# Patient Record
Sex: Female | Born: 1935 | Smoking: Never smoker
Health system: Southern US, Community
[De-identification: ages and names within clinical notes are randomized; demographics above are authoritative.]

## PROBLEM LIST (undated history)

## (undated) DIAGNOSIS — C50919 Malignant neoplasm of unspecified site of unspecified female breast: Secondary | ICD-10-CM

## (undated) HISTORY — PX: MASTECTOMY: SHX3

## (undated) HISTORY — PX: ABDOMINAL HYSTERECTOMY: SHX81

---

## 2017-05-27 ENCOUNTER — Ambulatory Visit: Payer: Medicare Other | Attending: Physician Assistant | Admitting: Physical Therapy

## 2017-05-27 DIAGNOSIS — I972 Postmastectomy lymphedema syndrome: Secondary | ICD-10-CM | POA: Diagnosis not present

## 2017-05-27 DIAGNOSIS — R29898 Other symptoms and signs involving the musculoskeletal system: Secondary | ICD-10-CM | POA: Diagnosis present

## 2017-05-27 NOTE — Therapy (Addendum)
[place], [place], [address] Phone: [phone]   Fax:  [phone]  Physical Therapy Evaluation  Patient Details  Name: Monique King MRN: [id number] Date of Birth: Sep 01, 1933 No Data Recorded  Encounter Date: 05/27/2017      PT End of Session - 05/27/17 2106    Visit Number 1   Number of Visits 7   Date for PT Re-Evaluation 07/03/17   PT Start Time 0935   PT Stop Time 1033   PT Time Calculation (min) 58 min   Activity Tolerance Patient tolerated treatment well   Behavior During Therapy [facility] for tasks assessed/performed      No past medical history on file.  No past surgical history on file.  There were no vitals filed for this visit.       Subjective Assessment - 05/27/17 0939    Subjective "I have no idea why I'm here today, except they want you to check this arm."  Daughter explains that patient just moved here from [place] and the doctor just wants [place] to check her lymphmedematous arm.   Patient is accompained by: Family member  daughter   Pertinent History Right breast 1976, treated with mastectomy + ALND (had positive lymph nodes), chemo and radiation. Swelling developed soon after that.  She has had a pump and has worn compression sleeves.  Now has a velcro.  Knee arthritis. Diabetes, type II with oral meds that control it.  Has had right RTC surgery. Has had a coupleof cellulitis infections in the right arm.  Has Fentanyl patch for right knee. Has had carpal tunnel surgery in right arm   Patient Stated Goals check in and get established locally re: lymphedema treatment   Currently in Pain? Yes   Pain Score 0-No pain  up to 6/10 when standing on it   Pain Location Knee   Pain Orientation Right;Left   Pain Descriptors / Indicators Dull   Aggravating Factors  standing on it   Pain Relieving Factors not standing on it, Fentanyl patch            [facility] PT Assessment -  05/27/17 0001      Assessment   Medical Diagnosis remote h/o breast cancer   Onset Date/Surgical Date --  in 1976 had radical mastectomy   Hand Dominance Right  but needs to use the left due to right UE non-function   Prior Therapy had assessment by PT [facility]     Precautions   Precautions Fall     Restrictions   Weight Bearing Restrictions No     Balance Screen   Has the patient fallen in the past 6 months No   Has the patient had a decrease in activity level because of a fear of falling?  No   Is the patient reluctant to leave their home because of a fear of falling?  No     Home Environment   Living Environment Assisted living  [place] - manual     Prior Function   Level of Independence Independent with household mobility with device;Independent with basic ADLs;Requires assistive device for independence   Vocation Retired   Leisure no exercise, but she may start PT for strengthening     Cognition   Overall Cognitive Status Impaired/Different from baseline  relies on her daughter to fill in details     Observation/Other Assessments   Observations older woman sitting in  wheelchair, leaning left, with right arm in velcro compression and lying flaccid on her lap.  Has an old Circaid Juxtafit with power sleeve on arm and hand; has a newer Circaid Juxtafit in the package.  Daughter has also brought some older garments.  Also has an Elvarex gauntlet.   Skin Integrity right hand is reddened and has some dead skin.     Sensation   Light Touch Not tested  but she reports it is numb in right UE     Posture/Postural Control   Posture/Postural Control Postural limitations   Postural Limitations Rounded Shoulders;Forward head     ROM / Strength   AROM / PROM / Strength AROM;PROM     AROM   Overall AROM Comments left arm AROM WFL; right arm has no functional movement and no visible muscle contraction at all     PROM    Overall PROM Comments right elbow to approx. 90 degrees flexion; grossly, fingers show almost no flexion passively     Transfers   Transfers Stand Pivot Transfers  stand by to moderate assist; tends to flop down     Ambulation/Gait   Gait Comments Comes in in a wheelchair; says she can walk in her room with a cane, due to arthritis in her knees.  She does not want knee replacement.           LYMPHEDEMA/ONCOLOGY QUESTIONNAIRE - 05/27/17 0957      Type   Cancer Type remote h/o right breast cancer     Surgeries   Mastectomy Date --  radical, in 1976   Axillary Lymph Node Dissection Date --  1976     Treatment   Past Chemotherapy Treatment Yes   Past Radiation Treatment Yes     What other symptoms do you have   Are you Having Heaviness or Tightness Yes     Lymphedema Assessments   Lymphedema Assessments Upper extremities     Right Upper Extremity Lymphedema   15 cm Proximal to Olecranon Process 27.9 cm   10 cm Proximal to Olecranon Process 27.8 cm   Olecranon Process 28.6 cm   15 cm Proximal to Ulnar Styloid Process 29.2 cm   10 cm Proximal to Ulnar Styloid Process 28.3 cm   Just Proximal to Ulnar Styloid Process 24 cm   Across Hand at [facility] 23.1 cm   At [place] of 2nd Digit 9.7 cm     Left Upper Extremity Lymphedema   15 cm Proximal to Olecranon Process 37.8 cm   10 cm Proximal to Olecranon Process 34.7 cm   Olecranon Process 26.5 cm   15 cm Proximal to Ulnar Styloid Process 26.7 cm   10 cm Proximal to Ulnar Styloid Process 24.8 cm   Just Proximal to Ulnar Styloid Process 18.1 cm   Across Hand at [facility] 19.8 cm   At [place] of 2nd Digit 6.5 cm         Objective measurements completed on examination: See above findings.                          Long Term Clinic Goals - 05/27/17 2125      CC Long Term Goal  #1   Title Patient's right finger and hand swelling will reduce enough for her to wear her newer Circaid Juxtafit  garment.   Time 2   Period Weeks   Status New     CC Long Term  Goal  #2   Title Pt.'s right 2nd finger circumference at base will reduce to 8.5 cm. or less   Baseline 9.7 at eval compared to 6.5 on left   Time 2   Period Weeks   Status New     CC Long Term Goal  #3   Title Patient's Rt. hand circumference will reduce to 21.5 cm. or less   Baseline 32.1 compared to 19.8 on left at eval   Time 2   Period Weeks   Status New     CC Long Term Goal  #4   Title lymphedema life impact scale impairment percentage will reduce to 40% or less   Baseline 57% at eval   Time 2   Period Weeks   Status New             Plan - 05/27/17 2108    Clinical Impression Statement This is a pleasant woman with a somewhat colorful personality who comes in in a wheelchair today.  Her right arm hangs limply in her lap and is wrapped in a well-worn Circaid Juxtafit garment. Her daughter (who is a Event organiser) is with her and fills in details when her mother is unclear about reporting.  Pt. had right breast cancer with radical mastectomy and ALND, along with chemo and radiation, some 40 years ago, and developed swelling not too long after that.  Weakness in the right arm developed over time so that now the arm is flaccid.  Her posture is very round-shouldered, but she tends to lean back in sitting.  Her right UE has significant swelling, particularly in the hand. Skin on the hand is reddened and dry except between fingers where there is mild maceration. The Circaid glove is too small so that her fingers don't go into it far enough.  She has a newer Circaid Juxtafit and a couple of other garments, one of them looking very old, that her daughter has brought along today.  The newer garment would certainly not fit her as the fingers on that glove are smaller than the one she has on today.  Pt. lives at assisted living, having just moved here days ago from [place]. She gets a bath 3x/week,  and aids remove the garment for that and then help her don it again.  It is difficult to put on because of her arm's flaccidity and current edema. She will benefit from a short course of bandaging her fingers and hand while continuing to use a Juxtafit garment on her arm in an effort to reduce the swelling in fingers and hand.  This should help the health of her skin and soft tissue of that area and also make it easier to don her newer Juxtafit garment.   History and Personal Factors relevant to plan of care: h/o cellulitis requiring hospitalization, diabetes, in wheelchair for someof her mobility, flaccid edematous arm and hand, possible memory problems and lives in assisted living facility   Clinical Presentation Unstable   Clinical Presentation due to: fragile skin on swollen hand with h/o cellulitis; mild skin maceration between fingers, with patient unable to manage right UE garment herself   Clinical Decision Making High   Rehab Potential Good   Clinical Impairments Affecting Rehab Potential flaccid right UE; not independent for ADLs   PT Frequency 3x / week   PT Duration 2 weeks   PT Treatment/Interventions ADLs/Self Care Home Management;DME Instruction;Patient/family education;Orthotic Fit/Training;Manual techniques;Manual lymph drainage;Compression bandaging   PT Next Visit  Plan Begin short (2 week) course of manual lymph drainage and bandaging of fingers and hand on right side while having her continue to use the Juxtafit on her arm.     Consulted and Agree with Plan of Care Patient;Family member/caregiver      Patient will benefit from skilled therapeutic intervention in order to improve the following deficits and impairments:  Increased edema, Decreased skin integrity, Decreased knowledge of use of DME  Visit Diagnosis: Postmastectomy lymphedema - Plan: PT plan of care cert/re-cert  Other symptoms and signs involving the musculoskeletal system - Plan: PT plan of care cert/re-cert       G-Codes - 06/26/17 2128    Functional Assessment Tool Used (Outpatient Only) lymphedema life impact scale   Functional Limitation Self care   Self Care Current Status (X4481) At least 20 percent but less than 40 percent impaired, limited or restricted    Self Care Goal Status = CI (at least 1% but less than 20% restricted)   Problem List There are no active problems to display for this patient.   [place] 06/26/2017, 9:33 PM  [place], [place], [address] Phone: [phone]   Fax:  [phone]  Name: Monique King MRN: [id number] Date of Birth: 13-Aug-1933  Serafina Royals, PT 06/26/17 9:34 PM  PHYSICAL THERAPY DISCHARGE SUMMARY  Visits from Start of Care: 1  Current functional level related to goals / functional outcomes: No goals met. Patient's daughter called to say the patient chooses not to have any therapy at this time.   Remaining deficits: Unknown, as patient did not return after evaluation.   Education / Equipment: Suggested treatment Plan: Patient agrees to discharge.  Patient goals were not met. Patient is being discharged due to the patient's request.  ?????    Serafina Royals, PT 06/23/17 6:12 PM

## 2017-06-10 ENCOUNTER — Ambulatory Visit: Payer: Medicare Other | Admitting: Physical Therapy

## 2019-09-30 ENCOUNTER — Encounter (HOSPITAL_COMMUNITY): Payer: Self-pay

## 2019-09-30 ENCOUNTER — Other Ambulatory Visit: Payer: Self-pay

## 2019-09-30 ENCOUNTER — Inpatient Hospital Stay (HOSPITAL_COMMUNITY)
Admission: EM | Admit: 2019-09-30 | Discharge: 2019-10-06 | DRG: 177 | Disposition: A | Discharge status: Skilled Nursing Facility | Payer: Medicare Other | Source: Skilled Nursing Facility | Attending: Internal Medicine | Admitting: Internal Medicine

## 2019-09-30 ENCOUNTER — Emergency Department (HOSPITAL_COMMUNITY): Payer: Medicare Other

## 2019-09-30 DIAGNOSIS — R0600 Dyspnea, unspecified: Secondary | ICD-10-CM

## 2019-09-30 DIAGNOSIS — I4510 Unspecified right bundle-branch block: Secondary | ICD-10-CM | POA: Diagnosis present

## 2019-09-30 DIAGNOSIS — R509 Fever, unspecified: Secondary | ICD-10-CM | POA: Diagnosis not present

## 2019-09-30 DIAGNOSIS — E119 Type 2 diabetes mellitus without complications: Secondary | ICD-10-CM | POA: Diagnosis present

## 2019-09-30 DIAGNOSIS — Z66 Do not resuscitate: Secondary | ICD-10-CM | POA: Diagnosis present

## 2019-09-30 DIAGNOSIS — J9601 Acute respiratory failure with hypoxia: Secondary | ICD-10-CM | POA: Diagnosis present

## 2019-09-30 DIAGNOSIS — D509 Iron deficiency anemia, unspecified: Secondary | ICD-10-CM | POA: Diagnosis present

## 2019-09-30 DIAGNOSIS — R011 Cardiac murmur, unspecified: Secondary | ICD-10-CM | POA: Diagnosis present

## 2019-09-30 DIAGNOSIS — I440 Atrioventricular block, first degree: Secondary | ICD-10-CM | POA: Diagnosis present

## 2019-09-30 DIAGNOSIS — I10 Essential (primary) hypertension: Secondary | ICD-10-CM | POA: Diagnosis present

## 2019-09-30 DIAGNOSIS — R0902 Hypoxemia: Secondary | ICD-10-CM

## 2019-09-30 DIAGNOSIS — I5032 Chronic diastolic (congestive) heart failure: Secondary | ICD-10-CM | POA: Diagnosis present

## 2019-09-30 DIAGNOSIS — I350 Nonrheumatic aortic (valve) stenosis: Secondary | ICD-10-CM | POA: Diagnosis present

## 2019-09-30 DIAGNOSIS — F0390 Unspecified dementia without behavioral disturbance: Secondary | ICD-10-CM | POA: Diagnosis present

## 2019-09-30 DIAGNOSIS — Z7984 Long term (current) use of oral hypoglycemic drugs: Secondary | ICD-10-CM

## 2019-09-30 DIAGNOSIS — L03113 Cellulitis of right upper limb: Secondary | ICD-10-CM | POA: Diagnosis present

## 2019-09-30 DIAGNOSIS — R7881 Bacteremia: Secondary | ICD-10-CM

## 2019-09-30 DIAGNOSIS — E785 Hyperlipidemia, unspecified: Secondary | ICD-10-CM | POA: Diagnosis present

## 2019-09-30 DIAGNOSIS — Z9011 Acquired absence of right breast and nipple: Secondary | ICD-10-CM

## 2019-09-30 DIAGNOSIS — D696 Thrombocytopenia, unspecified: Secondary | ICD-10-CM | POA: Diagnosis present

## 2019-09-30 DIAGNOSIS — B9561 Methicillin susceptible Staphylococcus aureus infection as the cause of diseases classified elsewhere: Secondary | ICD-10-CM | POA: Diagnosis present

## 2019-09-30 DIAGNOSIS — Z79891 Long term (current) use of opiate analgesic: Secondary | ICD-10-CM

## 2019-09-30 DIAGNOSIS — U071 COVID-19: Secondary | ICD-10-CM | POA: Diagnosis not present

## 2019-09-30 DIAGNOSIS — I890 Lymphedema, not elsewhere classified: Secondary | ICD-10-CM | POA: Diagnosis present

## 2019-09-30 DIAGNOSIS — J1289 Other viral pneumonia: Secondary | ICD-10-CM | POA: Diagnosis present

## 2019-09-30 DIAGNOSIS — E669 Obesity, unspecified: Secondary | ICD-10-CM | POA: Diagnosis present

## 2019-09-30 DIAGNOSIS — Z6829 Body mass index (BMI) 29.0-29.9, adult: Secondary | ICD-10-CM

## 2019-09-30 DIAGNOSIS — Z853 Personal history of malignant neoplasm of breast: Secondary | ICD-10-CM

## 2019-09-30 HISTORY — DX: Malignant neoplasm of unspecified site of unspecified female breast: C50.919

## 2019-09-30 LAB — TROPONIN I (HIGH SENSITIVITY)
Troponin I (High Sensitivity): 8 ng/L (ref <18)
Troponin I (High Sensitivity): 10 ng/L (ref <18)

## 2019-09-30 LAB — CBC WITH DIFFERENTIAL/PLATELET
Abs Immature Granulocytes: 0.03 10*3/uL (ref 0.00–0.07)
Basophils Absolute: 0 10*3/uL (ref 0.0–0.1)
Basophils Relative: 0 %
Eosinophils Absolute: 0 10*3/uL (ref 0.0–0.5)
Eosinophils Relative: 1 %
HCT: 38.9 % (ref 36.0–46.0)
Hemoglobin: 12.5 g/dL (ref 12.0–15.0)
Immature Granulocytes: 1 %
Lymphocytes Relative: 7 %
Lymphs Abs: 0.3 10*3/uL — ABNORMAL LOW (ref 0.7–4.0)
MCH: 32.2 pg (ref 26.0–34.0)
MCHC: 32.1 g/dL (ref 30.0–36.0)
MCV: 100.3 fL — ABNORMAL HIGH (ref 80.0–100.0)
Monocytes Absolute: 0.2 10*3/uL (ref 0.1–1.0)
Monocytes Relative: 5 %
Neutro Abs: 2.9 10*3/uL (ref 1.7–7.7)
Neutrophils Relative %: 86 %
Platelets: 89 10*3/uL — ABNORMAL LOW (ref 150–400)
RBC: 3.88 MIL/uL (ref 3.87–5.11)
RDW: 13.6 % (ref 11.5–15.5)
WBC: 3.4 10*3/uL — ABNORMAL LOW (ref 4.0–10.5)
nRBC: 0 % (ref 0.0–0.2)

## 2019-09-30 LAB — COMPREHENSIVE METABOLIC PANEL
ALT: 19 U/L (ref 0–44)
AST: 21 U/L (ref 15–41)
Albumin: 3.7 g/dL (ref 3.5–5.0)
Alkaline Phosphatase: 120 U/L (ref 38–126)
Anion gap: 8 (ref 5–15)
BUN: 15 mg/dL (ref 8–23)
CO2: 24 mmol/L (ref 22–32)
Calcium: 7.9 mg/dL — ABNORMAL LOW (ref 8.9–10.3)
Chloride: 106 mmol/L (ref 98–111)
Creatinine, Ser: 0.67 mg/dL (ref 0.44–1.00)
GFR calc Af Amer: >60 mL/min (ref >60)
GFR calc non Af Amer: >60 mL/min (ref >60)
Glucose, Bld: 163 mg/dL — ABNORMAL HIGH (ref 70–99)
Potassium: 3.8 mmol/L (ref 3.5–5.1)
Sodium: 138 mmol/L (ref 135–145)
Total Bilirubin: 0.6 mg/dL (ref 0.3–1.2)
Total Protein: 6.6 g/dL (ref 6.5–8.1)

## 2019-09-30 LAB — BRAIN NATRIURETIC PEPTIDE: B Natriuretic Peptide: 38.4 pg/mL (ref 0.0–100.0)

## 2019-09-30 LAB — LACTIC ACID, PLASMA
Lactic Acid, Venous: 1.4 mmol/L (ref 0.5–1.9)
Lactic Acid, Venous: 1.1 mmol/L (ref 0.5–1.9)

## 2019-09-30 MED ORDER — ACETAMINOPHEN 325 MG PO TABS
650.0000 mg | ORAL_TABLET | Freq: Once | ORAL | Status: AC
  Administered 2019-09-30: 650 mg via ORAL

## 2019-09-30 NOTE — ED Triage Notes (Addendum)
Pt BIB EMS from [facility]. Per home health nurse, pt is SOB on exertion. Per EMS, pt reading mid 80's O2 on RA. Pt does not use home O2. Pt has no hx of asthma, COPD. Pt denies pain. Pt c/o cough, fever.   103.3 temp 96% O2 on 4L [place] CBG 148  20G L hand  127/65 97 HR

## 2019-09-30 NOTE — ED Notes (Signed)
Spoke with floor nurse, [place] [phone], and relayed message from Maudie Mercury MD, that he would like to hold off on moving the pt upstairs until he receives ABG results.

## 2019-09-30 NOTE — H&P (Signed)
TRH H&P    Patient Demographics:    Monique King, is a 85 y.o. female  MRN: [id number]  DOB - Sep 03, 1933  Admit Date - 09/30/2019  Referring MD/NP/PA: Howell Pringle  Outpatient Primary MD for the patient is Reymundo Poll, MD  Patient coming from:  [place]  Chief complaint-  hypoxia   HPI:    Monique King  is a 85 y.o. female,  w h/o breast cancer, apparently presents from [place] secondary to cough, fever, and hypoxia. Pt appears to be a poor historian.  Pt denies cough, but coughs while Im in the room.  Appears nonproductive.   In ED,  T 101.9, P 90 R 22, Bp 146/75  Pox 96%  CXR IMPRESSION: No acute abnormality noted.  Wbc 3.4, Hgb 12.5, Plt 89 Na 138, K 3.8, Bun 15, Creatinine 0.67 Ast 21, Alt 19 Lactic acid 1.4  Blood culture x2 Urinalysis pending  Pt will be admitted for fever and ? Hypoxia.       Review of systems:    In addition to the HPI above,    No Headache, No changes with Vision or hearing, No problems swallowing food or Liquids, No Chest pain,  No Abdominal pain, No Nausea or Vomiting, bowel movements are regular, No Blood in stool or Urine, No dysuria, No new skin rashes or bruises, No new joints pains-aches,  No new weakness, tingling, numbness in any extremity, No recent weight gain or loss, No polyuria, polydypsia or polyphagia, No significant Mental Stressors.  All other systems reviewed and are negative.    Past History of the following :    Past Medical History:  Diagnosis Date  . Breast cancer [facility])       Past Surgical History:  Procedure Laterality Date  . ABDOMINAL HYSTERECTOMY    . MASTECTOMY Right       Social History:      Social History   Tobacco Use  . Smoking status: Never Smoker  . Smokeless tobacco: Never Used  Substance Use Topics  . Alcohol use: Never    Frequency: Never       Family History  :    History reviewed. No pertinent family history.  pt can't recall any family history.   Home Medications:   Prior to Admission medications   Medication Sig Start Date End Date Taking? Authorizing Provider  acetaminophen (TYLENOL) 325 MG tablet Take 325 mg by mouth 2 (two) times daily.    Yes [provider]  diazepam (VALIUM) 2 MG tablet Take 1 mg by mouth at bedtime as needed for anxiety.   Yes [provider]  docusate sodium (COLACE) 100 MG capsule Take 100 mg by mouth daily.   Yes [provider]  fentaNYL (DURAGESIC) 25 MCG/HR Place 1 patch onto the skin every 3 (three) days.   Yes [provider]  ferrous sulfate 325 (65 FE) MG EC tablet Take 325 mg by mouth See admin instructions. Every Monday, Wednesday, and Friday.   Yes [provider]  furosemide (LASIX)  20 MG tablet Take 20 mg by mouth.   Yes [provider]  glipiZIDE (GLUCOTROL) 5 MG tablet Take 2.5 mg by mouth daily before breakfast.   Yes [provider]  Infant Care Products (DERMACLOUD) CREA Apply 1 application topically 2 (two) times daily.   Yes [provider]  ketoconazole (NIZORAL) 2 % cream Apply 1 application topically at bedtime. Left breast and bottom.   Yes [provider]  mometasone (ELOCON) 0.1 % ointment Apply 1 application topically 2 (two) times daily. To affected areas on the body and ears.   Yes [provider]  selenium sulfide (SELSUN) 2.5 % shampoo Apply 1 application topically every other day.   Yes [provider]  simvastatin (ZOCOR) 10 MG tablet Take 10 mg by mouth daily.   Yes [provider]  traZODone (DESYREL) 50 MG tablet Take 50 mg by mouth at bedtime.   Yes [provider]  triamcinolone (KENALOG) 0.025 % cream Apply 1 application topically daily as needed. To left breast.   Yes [provider]     Allergies:    No Known Allergies   Physical Exam:   Vitals   Blood pressure (!) 142/66, pulse 89, temperature (!) 101.9 F (38.8 C), temperature source Oral, resp. rate (!) 23, SpO2 99 %.  1.  General: axoxo3  2. Psychiatric: euthymic  3. Neurologic: Cn 2-12 intact, reflexes 2+ symmetric, diffuse with no clonus Motor 5/5 in all 4 ext  4. HEENMT:  Anicteric, pupils 1.106mm symmetric, direct, consensual, intact Neck: no jvd  5. Respiratory : + crackles left base, and right mid lung , no wheezing  6. Cardiovascular : rrr s1, s2,  2/6 sem rusb 7. Gastrointestinal:  Abd: soft, nt, nd, +bs  8. Skin:  Ext: no c/c/e,. No rash  9.Musculoskeletal Good ROM    Data Review:    CBC Recent Labs  Lab 09/30/19 1728  WBC 3.4*  HGB 12.5  HCT 38.9  PLT 89*  MCV 100.3*  MCH 32.2  MCHC 32.1  RDW 13.6  LYMPHSABS 0.3*  MONOABS 0.2  EOSABS 0.0  BASOSABS 0.0   ------------------------------------------------------------------------------------------------------------------  Results for orders placed or performed during the hospital encounter of 09/30/19 (from the past 48 hour(s))  Lactic acid, plasma     Status: None   Collection Time: 09/30/19  5:28 PM  Result Value Ref Range   Lactic Acid, Venous 1.4 0.5 - 1.9 mmol/L    Comment: Performed at [facility], [place] [address], [place], [place] [address]  CBC with Differential     Status: Abnormal   Collection Time: 09/30/19  5:28 PM  Result Value Ref Range   WBC 3.4 (L) 4.0 - 10.5 K/uL   RBC 3.88 3.87 - 5.11 MIL/uL   Hemoglobin 12.5 12.0 - 15.0 g/dL   HCT 38.9 36.0 - 46.0 %   MCV 100.3 (H) 80.0 - 100.0 fL   MCH 32.2 26.0 - 34.0 pg   MCHC 32.1 30.0 - 36.0 g/dL   RDW 13.6 11.5 - 15.5 %   Platelets 89 (L) 150 - 400 K/uL    Comment: REPEATED TO VERIFY PLATELET COUNT CONFIRMED BY SMEAR SPECIMEN CHECKED FOR CLOTS Immature Platelet Fraction may be clinically indicated, consider ordering this additional test [id number]    nRBC 0.0 0.0 - 0.2 %   Neutrophils Relative %  86 %   Neutro Abs 2.9 1.7 - 7.7 K/uL   Lymphocytes Relative 7 %   Lymphs Abs 0.3 (  L) 0.7 - 4.0 K/uL   Monocytes Relative 5 %   Monocytes Absolute 0.2 0.1 - 1.0 K/uL   Eosinophils Relative 1 %   Eosinophils Absolute 0.0 0.0 - 0.5 K/uL   Basophils Relative 0 %   Basophils Absolute 0.0 0.0 - 0.1 K/uL   Immature Granulocytes 1 %   Abs Immature Granulocytes 0.03 0.00 - 0.07 K/uL    Comment: Performed at [facility], [place] [address] [place], [place] [address]  Brain natriuretic peptide     Status: None   Collection Time: 09/30/19  5:28 PM  Result Value Ref Range   B Natriuretic Peptide 38.4 0.0 - 100.0 pg/mL    Comment: Performed at [facility], [place] [address], [place], [place] [address]  Troponin I (High Sensitivity)     Status: None   Collection Time: 09/30/19  5:28 PM  Result Value Ref Range   Troponin I (High Sensitivity) 8 <18 ng/L    Comment: (NOTE) Elevated high sensitivity troponin I (hsTnI) values and significant  changes across serial measurements may suggest ACS but many other  chronic and acute conditions are known to elevate hsTnI results.  Refer to the Links section for chest pain algorithms and additional  guidance. Performed at [facility], [place] [address] [place], [place] [address]   Lactic acid, plasma     Status: None   Collection Time: 09/30/19  7:11 PM  Result Value Ref Range   Lactic Acid, Venous 1.1 0.5 - 1.9 mmol/L    Comment: Performed at [facility], [place] [address] [place], [place] [address]  Comprehensive metabolic panel     Status: Abnormal   Collection Time: 09/30/19  7:11 PM  Result Value Ref Range   Sodium 138 135 - 145 mmol/L   Potassium 3.8 3.5 - 5.1 mmol/L   Chloride 106 98 - 111 mmol/L   CO2 24 22 - 32 mmol/L   Glucose, Bld 163 (H) 70 - 99 mg/dL   BUN 15 8 - 23 mg/dL   Creatinine, Ser 0.67 0.44 - 1.00 mg/dL   Calcium 7.9 (L) 8.9 - 10.3 mg/dL   Total Protein 6.6  6.5 - 8.1 g/dL   Albumin 3.7 3.5 - 5.0 g/dL   AST 21 15 - 41 U/L   ALT 19 0 - 44 U/L   Alkaline Phosphatase 120 38 - 126 U/L   Total Bilirubin 0.6 0.3 - 1.2 mg/dL   GFR calc non Af Amer >60 >60 mL/min   GFR calc Af Amer >60 >60 mL/min   Anion gap 8 5 - 15    Comment: Performed at [facility], [place] [address] [place], [place] [address]  Troponin I (High Sensitivity)     Status: None   Collection Time: 09/30/19  7:11 PM  Result Value Ref Range   Troponin I (High Sensitivity) 10 <18 ng/L    Comment: (NOTE) Elevated high sensitivity troponin I (hsTnI) values and significant  changes across serial measurements may suggest ACS but many other  chronic and acute conditions are known to elevate hsTnI results.  Refer to the "Links" section for chest pain algorithms and additional  guidance. Performed at [facility], [place] [address] [place], [place] [address]     Chemistries  Recent Labs  Lab 09/30/19 1911  NA 138  K 3.8  CL 106  CO2 24  GLUCOSE 163*  BUN 15  CREATININE 0.67  CALCIUM 7.9*  AST 21  ALT 19  ALKPHOS 120  BILITOT 0.6   ------------------------------------------------------------------------------------------------------------------  ------------------------------------------------------------------------------------------------------------------ GFR: CrCl cannot be calculated (Unknown ideal weight.). Liver Function Tests: Recent Labs  Lab 09/30/19 1911  AST 21  ALT 19  ALKPHOS 120  BILITOT 0.6  PROT 6.6  ALBUMIN 3.7   No results for input(s): LIPASE, AMYLASE in the last 168 hours. No results for input(s): AMMONIA in the last 168 hours. Coagulation Profile: No results for input(s): INR, PROTIME in the last 168 hours. Cardiac Enzymes: No results for input(s): CKTOTAL, CKMB, CKMBINDEX, TROPONINI in the last 168 hours. BNP (last 3 results) No results for input(s): PROBNP in the last 8760 hours. HbA1C: No results  for input(s): HGBA1C in the last 72 hours. CBG: No results for input(s): GLUCAP in the last 168 hours. Lipid Profile: No results for input(s): CHOL, HDL, LDLCALC, TRIG, CHOLHDL, LDLDIRECT in the last 72 hours. Thyroid Function Tests: No results for input(s): TSH, T4TOTAL, FREET4, T3FREE, THYROIDAB in the last 72 hours. Anemia Panel: No results for input(s): VITAMINB12, FOLATE, FERRITIN, TIBC, IRON, RETICCTPCT in the last 72 hours.  --------------------------------------------------------------------------------------------------------------- Urine analysis: No results found for: COLORURINE, APPEARANCEUR, LABSPEC, PHURINE, GLUCOSEU, HGBUR, BILIRUBINUR, KETONESUR, PROTEINUR, UROBILINOGEN, NITRITE, LEUKOCYTESUR    Imaging Results:    Dg Chest Portable 1 View  Result Date: 09/30/2019 CLINICAL DATA:  Cough and shortness of breath EXAM: PORTABLE CHEST 1 VIEW COMPARISON:  None. FINDINGS: Cardiac shadows within normal limits. Tortuous thoracic aorta is noted. Lungs are hypoinflated with mild crowding of the vascular markings. No focal confluent infiltrate is seen. Prior clavicular fracture is noted with some dystrophic calcification. No acute bony abnormality is noted. IMPRESSION: No acute abnormality noted. Electronically Signed   By: Inez Catalina M.D.   On: 09/30/2019 19:51      Assessment & Plan:    Principal Problem:   Acute respiratory failure with hypoxia (HCC) Active Problems:   Cardiac murmur  Acute respiratory failure with hypoxia ? Check d dimer, if positive then CTA chest  Fever  Blood culture x2 Urinalysis pending covid-19 pending  Thrombocytopenia Check cbc in am  Cardiac murmer Check cardiac echo   Addendum Covid -19 positive Pt will be transferred to [facility] Start dexamethasone 6mg  iv qday Start Remdesivir  DVT Prophylaxis-    SCDs  AM Labs Ordered, also please review Full Orders  Family Communication: Admission, patients condition and plan of care including  tests being ordered have been discussed with the patient  who indicate understanding and agree with the plan and Code Status.  Code Status:  FULL CODE per patient  Admission status: Observation: Based on patients clinical presentation and evaluation of above clinical data, I have made determination that patient meets observation criteria at this time.     Time spent in minutes : 55 minutes  Jani Gravel M.D on 09/30/2019 at 11:29 PM

## 2019-09-30 NOTE — ED Notes (Signed)
Patients daughter and health care POA- [name] [phone]- would like an update from provider once patient has a plan of care.

## 2019-09-30 NOTE — ED Provider Notes (Signed)
[facility] DEPT Provider Note   CSN: [id number] Arrival date & time: 09/30/19  1649     History   Chief Complaint Chief Complaint  Patient presents with  . Shortness of Breath  . Fever  . Cough    HPI Monique King is a 85 y.o. female.     The history is provided by the patient and medical records. No language interpreter was used.  Shortness of Breath Severity:  Severe Onset quality:  Gradual Duration:  3 days Timing:  Constant Progression:  Waxing and waning Chronicity:  New Context: URI   Relieved by:  Nothing Worsened by:  Coughing Associated symptoms: cough and fever   Associated symptoms: no abdominal pain, no chest pain, no diaphoresis, no headaches, no neck pain, no sputum production, no vomiting and no wheezing   Fever Associated symptoms: chills and cough   Associated symptoms: no chest pain, no congestion, no diarrhea, no dysuria, no headaches, no nausea and no vomiting   Cough Associated symptoms: chills, fever and shortness of breath   Associated symptoms: no chest pain, no diaphoresis, no headaches and no wheezing     History reviewed. No pertinent past medical history.  There are no active problems to display for this patient.   History reviewed. No pertinent surgical history.   OB History   No obstetric history on file.      Home Medications    Prior to Admission medications   Medication Sig Start Date End Date Taking? Authorizing Provider  acetaminophen (TYLENOL) 325 MG tablet Take 650 mg by mouth every 6 (six) hours as needed.    [provider]  acetaminophen (TYLENOL) 650 MG CR tablet Take 650 mg by mouth daily.    [provider]  aspirin EC 81 MG tablet Take 81 mg by mouth daily.    [provider]  diazepam (VALIUM) 5 MG tablet Take 5 mg by mouth at bedtime.    [provider]  fentaNYL (DURAGESIC - DOSED MCG/HR) 100 MCG/HR Place 100 mcg onto the skin every 3  (three) days.    [provider]  ferrous sulfate 325 (65 FE) MG EC tablet Take 325 mg by mouth daily.    [provider]  furosemide (LASIX) 20 MG tablet Take 20 mg by mouth.    [provider]  glimepiride (AMARYL) 1 MG tablet Take 1 mg by mouth daily with breakfast.    [provider]  nystatin cream (MYCOSTATIN) Apply 1 application topically 2 (two) times daily.    [provider]  traZODone (DESYREL) 50 MG tablet Take 50 mg by mouth at bedtime.    [provider]  triamcinolone (KENALOG) 0.025 % cream Apply 1 application topically 2 (two) times daily.    [provider]    Family History History reviewed. No pertinent family history.  Social History Social History   Tobacco Use  . Smoking status: Not on file  Substance Use Topics  . Alcohol use: Not on file  . Drug use: Not on file     Allergies   Patient has no known allergies.   Review of Systems Review of Systems  Constitutional: Positive for chills, fatigue and fever. Negative for diaphoresis.  HENT: Negative for congestion.   Eyes: Negative for visual disturbance.  Respiratory: Positive for cough and shortness of breath. Negative for sputum production, choking, chest tightness, wheezing and stridor.   Cardiovascular: Negative for chest pain, palpitations and leg swelling.  Gastrointestinal:  Negative for abdominal pain, constipation, diarrhea, nausea and vomiting.  Genitourinary: Negative for dysuria.  Musculoskeletal: Negative for back pain, neck pain and neck stiffness.  Neurological: Negative for headaches.  Psychiatric/Behavioral: Negative for agitation.  All other systems reviewed and are negative.    Physical Exam Updated Vital Signs BP (!) 146/75   Pulse 90   Temp (!) 101.9 F (38.8 C) (Oral)   Resp (!) 22   SpO2 96%   Physical Exam Vitals signs and nursing note reviewed.  Constitutional:      General: She is not in acute distress.     Appearance: She is well-developed. She is not ill-appearing, toxic-appearing or diaphoretic.  HENT:     Head: Normocephalic and atraumatic.     Right Ear: External ear normal.     Left Ear: External ear normal.     Nose: Nose normal.     Mouth/Throat:     Pharynx: No oropharyngeal exudate.  Eyes:     Conjunctiva/sclera: Conjunctivae normal.     Pupils: Pupils are equal, round, and reactive to light.  Neck:     Musculoskeletal: Normal range of motion and neck supple.  Cardiovascular:     Rate and Rhythm: Normal rate and regular rhythm.  Pulmonary:     Effort: Tachypnea present. No respiratory distress.     Breath sounds: No stridor. Rhonchi present. No wheezing or rales.  Chest:     Chest wall: No tenderness.  Abdominal:     General: There is no distension.     Tenderness: There is no abdominal tenderness. There is no rebound.  Musculoskeletal:     Right lower leg: She exhibits no tenderness. No edema.     Left lower leg: She exhibits no tenderness. No edema.  Skin:    General: Skin is warm.     Capillary Refill: Capillary refill takes less than 2 seconds.     Findings: No erythema or rash.  Neurological:     Mental Status: She is alert. She is confused.     Cranial Nerves: No dysarthria.     Motor: No abnormal muscle tone.     Coordination: Coordination normal.     Deep Tendon Reflexes: Reflexes are normal and symmetric.     Comments: Patient moving both legs and her left arm.  Patient is slightly confused.  Psychiatric:        Mood and Affect: Mood normal.      ED Treatments / Results  Labs (all labs ordered are listed, but only abnormal results are displayed) Labs Reviewed  CBC WITH DIFFERENTIAL/PLATELET - Abnormal; Notable for the following components:      Result Value   WBC 3.4 (*)    MCV 100.3 (*)    Platelets 89 (*)    Lymphs Abs 0.3 (*)    All other components within normal limits  COMPREHENSIVE METABOLIC PANEL - Abnormal; Notable for the following  components:   Glucose, Bld 163 (*)    Calcium 7.9 (*)    All other components within normal limits  CULTURE, BLOOD (ROUTINE X 2)  CULTURE, BLOOD (ROUTINE X 2)  URINE CULTURE  SARS CORONAVIRUS 2 (TAT 6-24 HRS)  LACTIC ACID, PLASMA  LACTIC ACID, PLASMA  BRAIN NATRIURETIC PEPTIDE  BLOOD GAS, ARTERIAL  URINALYSIS, ROUTINE W REFLEX MICROSCOPIC  D-DIMER, QUANTITATIVE (NOT AT [facility])  TROPONIN I (HIGH SENSITIVITY)  TROPONIN I (HIGH SENSITIVITY)    EKG EKG Interpretation  Date/Time:  Friday September 30 2019 17:14:06 EST Ventricular Rate:  90 PR Interval:    QRS Duration: 145 QT Interval:  406 QTC Calculation: 497 R Axis:   -35 Text Interpretation: Sinus rhythm Prolonged PR interval Left atrial enlargement Right bundle branch block Probable inferior infarct, age indeterminate No prior ECG for comparison. No STEMI Confirmed by Antony Blackbird [phone]) on 09/30/2019 5:23:20 PM   Radiology Dg Chest Portable 1 View  Result Date: 09/30/2019 CLINICAL DATA:  Cough and shortness of breath EXAM: PORTABLE CHEST 1 VIEW COMPARISON:  None. FINDINGS: Cardiac shadows within normal limits. Tortuous thoracic aorta is noted. Lungs are hypoinflated with mild crowding of the vascular markings. No focal confluent infiltrate is seen. Prior clavicular fracture is noted with some dystrophic calcification. No acute bony abnormality is noted. IMPRESSION: No acute abnormality noted. Electronically Signed   By: Inez Catalina M.D.   On: 09/30/2019 19:51    Procedures Procedures (including critical care time)  Jaydence Montano was evaluated in Emergency Department on 10/01/2019 for the symptoms described in the history of present illness. She was evaluated in the context of the global COVID-19 pandemic, which necessitated consideration that the patient might be at risk for infection with the SARS-CoV-2 virus that causes COVID-19. Institutional protocols and algorithms that pertain to the evaluation of patients at risk  for COVID-19 are in a state of rapid change based on information released by regulatory bodies including the CDC and federal and state organizations. These policies and algorithms were followed during the patient's care in the ED.   Medications Ordered in ED Medications  acetaminophen (TYLENOL) tablet 650 mg (650 mg Oral Given 09/30/19 2237)     Initial Impression / Assessment and Plan / ED Course  I have reviewed the triage vital signs and the nursing notes.  Pertinent labs & imaging results that were available during my care of the patient were reviewed by me and considered in my medical decision making (see chart for details).        Venita Weymer is a 85 y.o. female with unclear past medical history who presents with fevers, chills, cough, shortness of breath, and hypoxia.  Patient is from [place] and was found to be hypoxic today with oxygen saturations in the 80s on room air.  Patient is now on 4 L to maintain oxygen saturations in the mid 90s.  She reports is not the oxygen at home.  She does report less early she has had fevers, chills, and cough.  She is unsure of sick contacts.  She is slightly confused when answering questions repetitively.  She denies any headache, nausea, vomiting, chest pain, palpitations, urinary symptoms or GI symptoms.  She denies new pain or swelling in her legs.  She reports he does not notice her right arm from prior breast cancer surgery.  She reports no changes with her right arm.  She denies other complaints.  On arrival, patient was found to be febrile, tachypneic, and dependent on oxygen.  Her heart rate was under 100.  On exam, patient's lungs are coarse bilaterally.  Chest and abdomen nontender.  Patient has good pulses in her left arm and both legs.  Right arm is wrapped up and unchanged from her baseline by report.  Back nontender.  EKG shows no STEMI.  Clinically I am concerned patient has COVID-19 given the ongoing pandemic, her new  hypoxia and fever.  Will get imaging and labs to look for a bacterial pneumonia.  With lack of chest pain and her associated fever, I have lower suspicion for  thromboembolic etiology and more concerned about pneumonia or Covid.  We will get work-up initiated and she will need admission for hypoxia.     Covid test still in process however x-ray does not show pneumonia.  CBC and CMP overall reassuring aside from mild hypocalcemia and low white blood cell count..  Troponin negative.  Patient will be admitted for new hypoxia, possibly caused by Covid while awaiting result.   Final Clinical Impressions(s) / ED Diagnoses   Final diagnoses:  Hypoxia    ED Discharge Orders    None      Clinical Impression: 1. Hypoxia     Disposition: Admit  This note was prepared with assistance of Dragon voice recognition software. Occasional wrong-word or sound-a-like substitutions may have occurred due to the inherent limitations of voice recognition software.     Joory Gough, Gwenyth Allegra, MD 10/01/19 [id number]

## 2019-10-01 ENCOUNTER — Inpatient Hospital Stay (HOSPITAL_COMMUNITY): Payer: Medicare Other

## 2019-10-01 ENCOUNTER — Encounter (HOSPITAL_COMMUNITY): Payer: Self-pay

## 2019-10-01 ENCOUNTER — Observation Stay (HOSPITAL_COMMUNITY): Payer: Medicare Other

## 2019-10-01 DIAGNOSIS — U071 COVID-19: Secondary | ICD-10-CM | POA: Diagnosis present

## 2019-10-01 DIAGNOSIS — B9561 Methicillin susceptible Staphylococcus aureus infection as the cause of diseases classified elsewhere: Secondary | ICD-10-CM | POA: Diagnosis present

## 2019-10-01 DIAGNOSIS — Z7984 Long term (current) use of oral hypoglycemic drugs: Secondary | ICD-10-CM | POA: Diagnosis not present

## 2019-10-01 DIAGNOSIS — I440 Atrioventricular block, first degree: Secondary | ICD-10-CM | POA: Diagnosis present

## 2019-10-01 DIAGNOSIS — D696 Thrombocytopenia, unspecified: Secondary | ICD-10-CM | POA: Diagnosis present

## 2019-10-01 DIAGNOSIS — I5032 Chronic diastolic (congestive) heart failure: Secondary | ICD-10-CM | POA: Diagnosis present

## 2019-10-01 DIAGNOSIS — I361 Nonrheumatic tricuspid (valve) insufficiency: Secondary | ICD-10-CM

## 2019-10-01 DIAGNOSIS — M7989 Other specified soft tissue disorders: Secondary | ICD-10-CM | POA: Diagnosis not present

## 2019-10-01 DIAGNOSIS — E785 Hyperlipidemia, unspecified: Secondary | ICD-10-CM | POA: Diagnosis present

## 2019-10-01 DIAGNOSIS — R509 Fever, unspecified: Secondary | ICD-10-CM | POA: Diagnosis not present

## 2019-10-01 DIAGNOSIS — E669 Obesity, unspecified: Secondary | ICD-10-CM | POA: Diagnosis present

## 2019-10-01 DIAGNOSIS — I350 Nonrheumatic aortic (valve) stenosis: Secondary | ICD-10-CM

## 2019-10-01 DIAGNOSIS — L538 Other specified erythematous conditions: Secondary | ICD-10-CM | POA: Diagnosis not present

## 2019-10-01 DIAGNOSIS — R0902 Hypoxemia: Secondary | ICD-10-CM | POA: Diagnosis present

## 2019-10-01 DIAGNOSIS — Z79891 Long term (current) use of opiate analgesic: Secondary | ICD-10-CM | POA: Diagnosis not present

## 2019-10-01 DIAGNOSIS — I890 Lymphedema, not elsewhere classified: Secondary | ICD-10-CM | POA: Diagnosis present

## 2019-10-01 DIAGNOSIS — L03113 Cellulitis of right upper limb: Secondary | ICD-10-CM | POA: Diagnosis present

## 2019-10-01 DIAGNOSIS — J1289 Other viral pneumonia: Secondary | ICD-10-CM | POA: Diagnosis present

## 2019-10-01 DIAGNOSIS — Z6829 Body mass index (BMI) 29.0-29.9, adult: Secondary | ICD-10-CM | POA: Diagnosis not present

## 2019-10-01 DIAGNOSIS — J9601 Acute respiratory failure with hypoxia: Secondary | ICD-10-CM | POA: Diagnosis present

## 2019-10-01 DIAGNOSIS — I4510 Unspecified right bundle-branch block: Secondary | ICD-10-CM | POA: Diagnosis present

## 2019-10-01 DIAGNOSIS — E119 Type 2 diabetes mellitus without complications: Secondary | ICD-10-CM | POA: Diagnosis present

## 2019-10-01 DIAGNOSIS — Z853 Personal history of malignant neoplasm of breast: Secondary | ICD-10-CM | POA: Diagnosis not present

## 2019-10-01 DIAGNOSIS — Z66 Do not resuscitate: Secondary | ICD-10-CM | POA: Diagnosis present

## 2019-10-01 DIAGNOSIS — D509 Iron deficiency anemia, unspecified: Secondary | ICD-10-CM | POA: Diagnosis present

## 2019-10-01 DIAGNOSIS — R7881 Bacteremia: Secondary | ICD-10-CM | POA: Diagnosis present

## 2019-10-01 DIAGNOSIS — F0390 Unspecified dementia without behavioral disturbance: Secondary | ICD-10-CM | POA: Diagnosis present

## 2019-10-01 DIAGNOSIS — Z9011 Acquired absence of right breast and nipple: Secondary | ICD-10-CM | POA: Diagnosis not present

## 2019-10-01 DIAGNOSIS — I10 Essential (primary) hypertension: Secondary | ICD-10-CM | POA: Diagnosis present

## 2019-10-01 LAB — CK TOTAL AND CKMB (NOT AT ARMC)
CK, MB: 1 ng/mL (ref 0.5–5.0)
Relative Index: INVALID (ref 0.0–2.5)
Total CK: 62 U/L (ref 38–234)

## 2019-10-01 LAB — COMPREHENSIVE METABOLIC PANEL
ALT: 17 U/L (ref 0–44)
AST: 21 U/L (ref 15–41)
Albumin: 3.6 g/dL (ref 3.5–5.0)
Alkaline Phosphatase: 115 U/L (ref 38–126)
Anion gap: 9 (ref 5–15)
BUN: 11 mg/dL (ref 8–23)
CO2: 26 mmol/L (ref 22–32)
Calcium: 8.2 mg/dL — ABNORMAL LOW (ref 8.9–10.3)
Chloride: 104 mmol/L (ref 98–111)
Creatinine, Ser: 0.64 mg/dL (ref 0.44–1.00)
GFR calc Af Amer: >60 mL/min (ref >60)
GFR calc non Af Amer: >60 mL/min (ref >60)
Glucose, Bld: 144 mg/dL — ABNORMAL HIGH (ref 70–99)
Potassium: 3.9 mmol/L (ref 3.5–5.1)
Sodium: 139 mmol/L (ref 135–145)
Total Bilirubin: 0.8 mg/dL (ref 0.3–1.2)
Total Protein: 6.4 g/dL — ABNORMAL LOW (ref 6.5–8.1)

## 2019-10-01 LAB — SEDIMENTATION RATE: Sed Rate: 28 mm/hr — ABNORMAL HIGH (ref 0–22)

## 2019-10-01 LAB — ECHOCARDIOGRAM COMPLETE
Height: 65 in
Weight: 3015.89 oz

## 2019-10-01 LAB — BLOOD GAS, ARTERIAL
Acid-Base Excess: 0.4 mmol/L (ref 0.0–2.0)
Bicarbonate: 24.7 mmol/L (ref 20.0–28.0)
O2 Saturation: 96.4 %
Patient temperature: 99.6
pCO2 arterial: 41.6 mmHg (ref 32.0–48.0)
pH, Arterial: 7.394 (ref 7.350–7.450)
pO2, Arterial: 90.3 mmHg (ref 83.0–108.0)

## 2019-10-01 LAB — ECHOCARDIOGRAM LIMITED
Height: 65 in
Weight: 3015.89 oz

## 2019-10-01 LAB — CBC
HCT: 35.5 % — ABNORMAL LOW (ref 36.0–46.0)
Hemoglobin: 11.2 g/dL — ABNORMAL LOW (ref 12.0–15.0)
MCH: 31.8 pg (ref 26.0–34.0)
MCHC: 31.5 g/dL (ref 30.0–36.0)
MCV: 100.9 fL — ABNORMAL HIGH (ref 80.0–100.0)
Platelets: 106 10*3/uL — ABNORMAL LOW (ref 150–400)
RBC: 3.52 MIL/uL — ABNORMAL LOW (ref 3.87–5.11)
RDW: 13.2 % (ref 11.5–15.5)
WBC: 4.1 10*3/uL (ref 4.0–10.5)
nRBC: 0 % (ref 0.0–0.2)

## 2019-10-01 LAB — GLUCOSE, CAPILLARY
Glucose-Capillary: 144 mg/dL — ABNORMAL HIGH (ref 70–99)
Glucose-Capillary: 162 mg/dL — ABNORMAL HIGH (ref 70–99)
Glucose-Capillary: 264 mg/dL — ABNORMAL HIGH (ref 70–99)
Glucose-Capillary: 212 mg/dL — ABNORMAL HIGH (ref 70–99)

## 2019-10-01 LAB — SARS CORONAVIRUS 2 (TAT 6-24 HRS): SARS Coronavirus 2: POSITIVE — AB

## 2019-10-01 LAB — MRSA PCR SCREENING: MRSA by PCR: NEGATIVE

## 2019-10-01 LAB — FIBRINOGEN: Fibrinogen: 537 mg/dL — ABNORMAL HIGH (ref 210–475)

## 2019-10-01 LAB — D-DIMER, QUANTITATIVE: D-Dimer, Quant: >20 ug/mL-FEU — ABNORMAL HIGH (ref 0.00–0.50)

## 2019-10-01 LAB — C-REACTIVE PROTEIN: CRP: 6.4 mg/dL — ABNORMAL HIGH (ref <1.0)

## 2019-10-01 MED ORDER — ACETAMINOPHEN 325 MG PO TABS
650.0000 mg | ORAL_TABLET | Freq: Four times a day (QID) | ORAL | Status: DC | PRN
  Administered 2019-10-01 – 2019-10-02 (×2): 650 mg via ORAL
  Filled 2019-10-01 (×3): qty 2

## 2019-10-01 MED ORDER — INSULIN ASPART 100 UNIT/ML ~~LOC~~ SOLN
0.0000 [IU] | Freq: Every day | SUBCUTANEOUS | Status: DC
  Administered 2019-10-01: 2 [IU] via SUBCUTANEOUS
  Administered 2019-10-03: 3 [IU] via SUBCUTANEOUS
  Administered 2019-10-05: 2 [IU] via SUBCUTANEOUS
  Filled 2019-10-01: qty 0.05

## 2019-10-01 MED ORDER — FENTANYL 25 MCG/HR TD PT72
1.0000 | MEDICATED_PATCH | TRANSDERMAL | Status: DC
  Administered 2019-10-01 – 2019-10-04 (×2): 1 via TRANSDERMAL
  Filled 2019-10-01 (×2): qty 1

## 2019-10-01 MED ORDER — DIAZEPAM 2 MG PO TABS
1.0000 mg | ORAL_TABLET | Freq: Every evening | ORAL | Status: DC | PRN
  Administered 2019-10-02 – 2019-10-03 (×2): 1 mg via ORAL
  Filled 2019-10-01 (×2): qty 1

## 2019-10-01 MED ORDER — ENOXAPARIN SODIUM 40 MG/0.4ML ~~LOC~~ SOLN
40.0000 mg | Freq: Two times a day (BID) | SUBCUTANEOUS | Status: DC
  Administered 2019-10-01 – 2019-10-04 (×7): 40 mg via SUBCUTANEOUS
  Filled 2019-10-01 (×7): qty 0.4

## 2019-10-01 MED ORDER — DOCUSATE SODIUM 100 MG PO CAPS
100.0000 mg | ORAL_CAPSULE | Freq: Every day | ORAL | Status: DC
  Administered 2019-10-01 – 2019-10-06 (×5): 100 mg via ORAL
  Filled 2019-10-01 (×5): qty 1

## 2019-10-01 MED ORDER — TRAZODONE HCL 50 MG PO TABS
50.0000 mg | ORAL_TABLET | Freq: Every day | ORAL | Status: DC
  Administered 2019-10-01 – 2019-10-05 (×5): 50 mg via ORAL
  Filled 2019-10-01 (×5): qty 1

## 2019-10-01 MED ORDER — FUROSEMIDE 20 MG PO TABS
20.0000 mg | ORAL_TABLET | Freq: Every day | ORAL | Status: DC
  Administered 2019-10-01 – 2019-10-06 (×6): 20 mg via ORAL
  Filled 2019-10-01 (×6): qty 1

## 2019-10-01 MED ORDER — INSULIN ASPART 100 UNIT/ML ~~LOC~~ SOLN
0.0000 [IU] | Freq: Three times a day (TID) | SUBCUTANEOUS | Status: DC
  Administered 2019-10-01: 2 [IU] via SUBCUTANEOUS
  Administered 2019-10-01: 5 [IU] via SUBCUTANEOUS
  Administered 2019-10-01: 1 [IU] via SUBCUTANEOUS
  Administered 2019-10-02: 5 [IU] via SUBCUTANEOUS
  Administered 2019-10-02: 7 [IU] via SUBCUTANEOUS
  Administered 2019-10-03: 2 [IU] via SUBCUTANEOUS
  Administered 2019-10-03: 9 [IU] via SUBCUTANEOUS
  Administered 2019-10-04: 5 [IU] via SUBCUTANEOUS
  Administered 2019-10-04: 2 [IU] via SUBCUTANEOUS
  Administered 2019-10-05 – 2019-10-06 (×3): 5 [IU] via SUBCUTANEOUS
  Administered 2019-10-06: 1 [IU] via SUBCUTANEOUS
  Filled 2019-10-01: qty 0.09

## 2019-10-01 MED ORDER — SODIUM CHLORIDE 0.9 % IV SOLN
100.0000 mg | INTRAVENOUS | Status: AC
  Administered 2019-10-02 – 2019-10-05 (×4): 100 mg via INTRAVENOUS
  Filled 2019-10-01: qty 20
  Filled 2019-10-01 (×4): qty 100

## 2019-10-01 MED ORDER — FERROUS SULFATE 325 (65 FE) MG PO TABS
325.0000 mg | ORAL_TABLET | ORAL | Status: DC
  Administered 2019-10-03 – 2019-10-05 (×2): 325 mg via ORAL
  Filled 2019-10-01 (×2): qty 1

## 2019-10-01 MED ORDER — IOHEXOL 350 MG/ML SOLN
100.0000 mL | Freq: Once | INTRAVENOUS | Status: AC | PRN
  Administered 2019-10-01: 100 mL via INTRAVENOUS

## 2019-10-01 MED ORDER — ACETAMINOPHEN 650 MG RE SUPP: 650.0000 mg | Freq: Four times a day (QID) | RECTAL | Status: DC | PRN

## 2019-10-01 MED ORDER — SIMVASTATIN 20 MG PO TABS
10.0000 mg | ORAL_TABLET | Freq: Every day | ORAL | Status: DC
  Administered 2019-10-01 – 2019-10-05 (×5): 10 mg via ORAL
  Filled 2019-10-01 (×5): qty 1

## 2019-10-01 MED ORDER — SODIUM CHLORIDE (PF) 0.9 % IJ SOLN
INTRAMUSCULAR | Status: AC
  Filled 2019-10-01: qty 50

## 2019-10-01 MED ORDER — SODIUM CHLORIDE 0.9 % IV SOLN
200.0000 mg | Freq: Once | INTRAVENOUS | Status: AC
  Administered 2019-10-01: 200 mg via INTRAVENOUS
  Filled 2019-10-01: qty 40

## 2019-10-01 MED ORDER — DEXAMETHASONE SODIUM PHOSPHATE 10 MG/ML IJ SOLN
6.0000 mg | Freq: Every day | INTRAMUSCULAR | Status: DC
  Administered 2019-10-01: 6 mg via INTRAVENOUS
  Filled 2019-10-01 (×2): qty 0.6

## 2019-10-01 NOTE — Progress Notes (Signed)
[facility] arrive to take patient to [facility]. AM RN stated report had already been called. Related paperwork given to transporters. Pt stable upon departure.

## 2019-10-01 NOTE — Progress Notes (Signed)
Xcover Pt had unexplained hypoxia earlier, febrile, tachycardic, and covid-19 + and went to CTA chest r/o PE and apparently the iv infiltrated. ? 65mL per radiology tech.    Exam: Slight redness of the left antecub where Iv used to be 81mm 2cm area No blistering No ulceration Good radial/ulnar pulse Good capillary refil  A/p Infiltration of iv dye Elevate arm, ice Monitor, if there is blistering, or ulceration or increase in pain or swelling or decreased pulses or capillary refil, please contact plastic surgery   Notified daughter that patient is covid-19 positive as well as that her iv infiltrated

## 2019-10-01 NOTE — Progress Notes (Signed)
  Echocardiogram 2D Echocardiogram has been performed.  Monique King [name] 10/01/2019, 2:56 PM

## 2019-10-01 NOTE — Progress Notes (Signed)
Pt's IV infiltrated this morning during IV contrast CT scan. IV site is miminally swollen, no redness, pt denied pain. IV cath removed and cold compress applied, Dr. Maudie Mercury, MD at bedside to assess IV infiltration.

## 2019-10-01 NOTE — Progress Notes (Signed)
PROGRESS NOTE  Mckinlee Wadell [id number] DOB: 17-Aug-1933 DOA: 09/30/2019 PCP: Reymundo Poll, MD  Hospital Course/Subjective: Avree Heinert  is a 85 y.o. female,  w h/o breast cancer, apparently presents from [place] secondary to cough, fever, and hypoxia. Pt appears to be a poor historian.  She has a cough and tested COVID positive overnight.   Assessment/Plan: Principal Problem:   Acute respiratory failure with hypoxia (HCC) Active Problems:   Cardiac murmur  Acute respiratory failure with hypoxia Ddimer up, plan for CTA chest but IV infiltrated and NM VQ scan is ordered  Fever  Blood culture x2 Urinalysis pending covid-19 positive  Thrombocytopenia Check cbc in am  Cardiac murmer Check cardiac echo   Covid -19 positive Pt will be transferred to [facility] Start dexamethasone 6mg  iv qday Start Remdesivir  DVT Prophylaxis-    SCDs  AM Labs Ordered, also please review Full Orders  Family Communication: Admission, patients condition and plan of care including tests being ordered have been discussed with the patient  who indicate understanding and agree with the plan and Code Status.  Code Status:  FULL CODE per patient   Objective: Vitals:   10/01/19 0100 10/01/19 0130 10/01/19 0241 10/01/19 0305  BP: 135/72 138/64  119/64  Pulse: 84 80  78  Resp: (!) 24 (!) 23  (!) 24  Temp:   99.9 F (37.7 C) 100.1 F (37.8 C)  TempSrc:   Oral Oral  SpO2: 100% 100%  98%    Intake/Output Summary (Last 24 hours) at 10/01/2019 [id number] Last data filed at 10/01/2019 [id number] Gross per 24 hour  Intake -  Output 0 ml  Net 0 ml   There were no vitals filed for this visit.   Exam: General:  Alert, oriented, calm, in no acute distress, on 4L [place] Eyes: EOMI, clear sclerea Neck: supple, no masses, trachea mildline  Cardiovascular: RRR, no murmurs or rubs, no peripheral edema  Respiratory: clear to auscultation bilaterally, no wheezes, no crackles  Abdomen: soft,  nontender, nondistended, normal bowel tones heard  Skin: dry, no rashes  Musculoskeletal: no joint effusions, normal range of motion  Psychiatric: appropriate affect, normal speech  Neurologic: extraocular muscles intact, clear speech, moving all extremities with intact sensorium    Data Reviewed: CBC: Recent Labs  Lab 09/30/19 1728 10/01/19 0435  WBC 3.4* 4.1  NEUTROABS 2.9  --   HGB 12.5 11.2*  HCT 38.9 35.5*  MCV 100.3* 100.9*  PLT 89* [id number]*   Basic Metabolic Panel: Recent Labs  Lab 09/30/19 1911 10/01/19 0435  NA 138 139  K 3.8 3.9  CL 106 104  CO2 24 26  GLUCOSE 163* 144*  BUN 15 11  CREATININE 0.67 0.64  CALCIUM 7.9* 8.2*   GFR: CrCl cannot be calculated (Unknown ideal weight.). Liver Function Tests: Recent Labs  Lab 09/30/19 1911 10/01/19 0435  AST 21 21  ALT 19 17  ALKPHOS 120 115  BILITOT 0.6 0.8  PROT 6.6 6.4*  ALBUMIN 3.7 3.6   No results for input(s): LIPASE, AMYLASE in the last 168 hours. No results for input(s): AMMONIA in the last 168 hours. Coagulation Profile: No results for input(s): INR, PROTIME in the last 168 hours. Cardiac Enzymes: No results for input(s): CKTOTAL, CKMB, CKMBINDEX, TROPONINI in the last 168 hours. BNP (last 3 results) No results for input(s): PROBNP in the last 8760 hours. HbA1C: No results for input(s): HGBA1C in the last 72 hours. CBG: No results for input(s): GLUCAP in the last 168  hours. Lipid Profile: No results for input(s): CHOL, HDL, LDLCALC, TRIG, CHOLHDL, LDLDIRECT in the last 72 hours. Thyroid Function Tests: No results for input(s): TSH, T4TOTAL, FREET4, T3FREE, THYROIDAB in the last 72 hours. Anemia Panel: No results for input(s): VITAMINB12, FOLATE, FERRITIN, TIBC, IRON, RETICCTPCT in the last 72 hours. Urine analysis: No results found for: COLORURINE, APPEARANCEUR, LABSPEC, PHURINE, GLUCOSEU, HGBUR, BILIRUBINUR, KETONESUR, PROTEINUR, UROBILINOGEN, NITRITE, LEUKOCYTESUR Sepsis Labs:  @[email or web address] (procalcitonin:4,lacticidven:4)  ) Recent Results (from the past 240 hour(s))  SARS CORONAVIRUS 2 (TAT 6-24 HRS) Nasopharyngeal Nasopharyngeal Swab     Status: Abnormal   Collection Time: 09/30/19  6:28 PM   Specimen: Nasopharyngeal Swab  Result Value Ref Range Status   SARS Coronavirus 2 POSITIVE (A) NEGATIVE Final    Comment: RESULT CALLED TO, READ BACK BY AND VERIFIED WITH: [place] [id number] 10/01/2019 MCCORMICK K (NOTE) SARS-CoV-2 target nucleic acids are DETECTED. The SARS-CoV-2 RNA is generally detectable in upper and lower respiratory specimens during the acute phase of infection. Positive results are indicative of active infection with SARS-CoV-2. Clinical  correlation with patient history and other diagnostic information is necessary to determine patient infection status. Positive results do  not rule out bacterial infection or co-infection with other viruses. The expected result is Negative. Fact Sheet for Patients: SugarRoll.be Fact Sheet for Healthcare Providers: https://www.woods-mathews.com/ This test is not yet approved or cleared by the [place] FDA and  has been authorized for detection and/or diagnosis of SARS-CoV-2 by FDA under an Emergency Use Authorization (EUA). This EUA will remain  in effect (meaning this test can be used)  for the duration of the COVID-19 declaration under Section 564(b)(1) of the Act, 21 U.S.C. section 360bbb-3(b)(1), unless the authorization is terminated or revoked sooner. Performed at [facility], [place] [address], [place], [place] [address]      Studies: Dg Chest Portable 1 View  Result Date: 09/30/2019 CLINICAL DATA:  Cough and shortness of breath EXAM: PORTABLE CHEST 1 VIEW COMPARISON:  None. FINDINGS: Cardiac shadows within normal limits. Tortuous thoracic aorta is noted. Lungs are hypoinflated with mild crowding of the vascular markings. No focal confluent  infiltrate is seen. Prior clavicular fracture is noted with some dystrophic calcification. No acute bony abnormality is noted. IMPRESSION: No acute abnormality noted. Electronically Signed   By: Inez Catalina M.D.   On: 09/30/2019 19:51    Scheduled Meds: . dexamethasone (DECADRON) injection  6 mg Intravenous Daily  . docusate sodium  100 mg Oral Daily  . fentaNYL  1 patch Transdermal Q72H  . [START ON 10/03/2019] ferrous sulfate  325 mg Oral Once per day on Mon Wed Fri  . furosemide  20 mg Oral Daily  . insulin aspart  0-5 Units Subcutaneous QHS  . insulin aspart  0-9 Units Subcutaneous TID WC  . simvastatin  10 mg Oral q1800  . sodium chloride (PF)      . traZODone  50 mg Oral QHS    Continuous Infusions: . [START ON 10/02/2019] remdesivir 100 mg in NS 250 mL    . remdesivir 200 mg in NS 250 mL       LOS: 0 days   Time spent: 25 minutes  Kameka Whan Marry Guan, MD [facility] Pager [phone]  If 7PM-7AM, please contact night-coverage [email or web address] Password [email or web address] 10/01/2019, 8:03 AM

## 2019-10-01 NOTE — ED Notes (Signed)
ED TO INPATIENT HANDOFF REPORT  Name/Age/Gender Monique King 85 y.o. female  Code Status   Home/SNF/Other Nursing Home  Chief Complaint shob, fever  Level of Care/Admitting Diagnosis ED Disposition    ED Disposition Condition Comment   Admit  Hospital Area: [place] [id number]  Level of Care: Telemetry [5]  Admit to tele based on following criteria: Monitor for Ischemic changes  Covid Evaluation: Person Under Investigation (PUI)  Diagnosis: Acute respiratory failure with hypoxia [id number]  Admitting Physician: Jani Gravel [[id number]]  Attending Physician: Jani Gravel [[id number]]  PT Class (Do Not Modify): Observation [104]  PT Acc Code (Do Not Modify): Observation [10022]       Medical History Past Medical History:  Diagnosis Date  . Breast cancer ([place])     Allergies No Known Allergies  IV Location/Drains/Wounds Patient Lines/Drains/Airways Status   Active Line/Drains/Airways    None          Labs/Imaging Results for orders placed or performed during the hospital encounter of 09/30/19 (from the past 48 hour(s))  Lactic acid, plasma     Status: None   Collection Time: 09/30/19  5:28 PM  Result Value Ref Range   Lactic Acid, Venous 1.4 0.5 - 1.9 mmol/L    Comment: Performed at [facility], [place] [address], [place], [place] [address]  CBC with Differential     Status: Abnormal   Collection Time: 09/30/19  5:28 PM  Result Value Ref Range   WBC 3.4 (L) 4.0 - 10.5 K/uL   RBC 3.88 3.87 - 5.11 MIL/uL   Hemoglobin 12.5 12.0 - 15.0 g/dL   HCT 38.9 36.0 - 46.0 %   MCV 100.3 (H) 80.0 - 100.0 fL   MCH 32.2 26.0 - 34.0 pg   MCHC 32.1 30.0 - 36.0 g/dL   RDW 13.6 11.5 - 15.5 %   Platelets 89 (L) 150 - 400 K/uL    Comment: REPEATED TO VERIFY PLATELET COUNT CONFIRMED BY SMEAR SPECIMEN CHECKED FOR CLOTS Immature Platelet Fraction may be clinically indicated, consider ordering this additional test [id number]    nRBC 0.0 0.0 -  0.2 %   Neutrophils Relative % 86 %   Neutro Abs 2.9 1.7 - 7.7 K/uL   Lymphocytes Relative 7 %   Lymphs Abs 0.3 (L) 0.7 - 4.0 K/uL   Monocytes Relative 5 %   Monocytes Absolute 0.2 0.1 - 1.0 K/uL   Eosinophils Relative 1 %   Eosinophils Absolute 0.0 0.0 - 0.5 K/uL   Basophils Relative 0 %   Basophils Absolute 0.0 0.0 - 0.1 K/uL   Immature Granulocytes 1 %   Abs Immature Granulocytes 0.03 0.00 - 0.07 K/uL    Comment: Performed at [facility], [place] [address] [place], [place] [address]  Brain natriuretic peptide     Status: None   Collection Time: 09/30/19  5:28 PM  Result Value Ref Range   B Natriuretic Peptide 38.4 0.0 - 100.0 pg/mL    Comment: Performed at [facility], [place] [address] [place], [place] [address]  Troponin I (High Sensitivity)     Status: None   Collection Time: 09/30/19  5:28 PM  Result Value Ref Range   Troponin I (High Sensitivity) 8 <18 ng/L    Comment: (NOTE) Elevated high sensitivity troponin I (hsTnI) values and significant  changes across serial measurements may suggest ACS but many other  chronic and acute conditions are known to elevate hsTnI results.  Refer to  the Links section for chest pain algorithms and additional  guidance. Performed at [facility], [place] [address], [place], [place] [address]   Lactic acid, plasma     Status: None   Collection Time: 09/30/19  7:11 PM  Result Value Ref Range   Lactic Acid, Venous 1.1 0.5 - 1.9 mmol/L    Comment: Performed at [facility], [place] [address], [place], [place] [address]  Comprehensive metabolic panel     Status: Abnormal   Collection Time: 09/30/19  7:11 PM  Result Value Ref Range   Sodium 138 135 - 145 mmol/L   Potassium 3.8 3.5 - 5.1 mmol/L   Chloride 106 98 - 111 mmol/L   CO2 24 22 - 32 mmol/L   Glucose, Bld 163 (H) 70 - 99 mg/dL   BUN 15 8 - 23 mg/dL   Creatinine, Ser 0.67 0.44 - 1.00 mg/dL   Calcium 7.9 (L) 8.9 -  10.3 mg/dL   Total Protein 6.6 6.5 - 8.1 g/dL   Albumin 3.7 3.5 - 5.0 g/dL   AST 21 15 - 41 U/L   ALT 19 0 - 44 U/L   Alkaline Phosphatase 120 38 - 126 U/L   Total Bilirubin 0.6 0.3 - 1.2 mg/dL   GFR calc non Af Amer >60 >60 mL/min   GFR calc Af Amer >60 >60 mL/min   Anion gap 8 5 - 15    Comment: Performed at [facility], [place] [address] [place], [place] [address]  Troponin I (High Sensitivity)     Status: None   Collection Time: 09/30/19  7:11 PM  Result Value Ref Range   Troponin I (High Sensitivity) 10 <18 ng/L    Comment: (NOTE) Elevated high sensitivity troponin I (hsTnI) values and significant  changes across serial measurements may suggest ACS but many other  chronic and acute conditions are known to elevate hsTnI results.  Refer to the "Links" section for chest pain algorithms and additional  guidance. Performed at [facility], [place] [address] [place], [place] [address]   Blood gas, arterial     Status: None   Collection Time: 09/30/19 11:59 PM  Result Value Ref Range   pH, Arterial 7.394 7.350 - 7.450   pCO2 arterial 41.6 32.0 - 48.0 mmHg   pO2, Arterial 90.3 83.0 - 108.0 mmHg   Bicarbonate 24.7 20.0 - 28.0 mmol/L   Acid-Base Excess 0.4 0.0 - 2.0 mmol/L   O2 Saturation 96.4 %   Patient temperature 99.6    Allens test (pass/fail) PASS PASS    Comment: Performed at [facility], [place] [address], [place], [place] [address]   Dg Chest Portable 1 View  Result Date: 09/30/2019 CLINICAL DATA:  Cough and shortness of breath EXAM: PORTABLE CHEST 1 VIEW COMPARISON:  None. FINDINGS: Cardiac shadows within normal limits. Tortuous thoracic aorta is noted. Lungs are hypoinflated with mild crowding of the vascular markings. No focal confluent infiltrate is seen. Prior clavicular fracture is noted with some dystrophic calcification. No acute bony abnormality is noted. IMPRESSION: No acute abnormality noted. Electronically  Signed   By: Inez Catalina M.D.   On: 09/30/2019 19:51    Pending Labs Unresulted Labs (From admission, onward)    Start     Ordered   09/30/19 2246  D-dimer, quantitative (not at [facility])  Add-on,   AD     09/30/19 2245   09/30/19 1743  SARS CORONAVIRUS 2 (TAT 6-24 HRS)  Nasopharyngeal Nasopharyngeal Swab  (Symptomatic/High Risk of Exposure/Tier 1 Patients Labs with Precautions)  Once,   STAT    Question Answer Comment  Is this test for diagnosis or screening Diagnosis of ill patient   Symptomatic for COVID-19 as defined by CDC Yes   Date of Symptom Onset 09/28/2019   Hospitalized for COVID-19 Yes   Admitted to ICU for COVID-19 No   Previously tested for COVID-19 No   Resident in a congregate (group) care setting Unknown   Employed in healthcare setting Unknown   Pregnant No      09/30/19 1743   09/30/19 1734  Urinalysis, Routine w reflex microscopic  Once,   STAT     09/30/19 1733   09/30/19 1733  Urine culture  ONCE - STAT,   STAT     09/30/19 1733   09/30/19 1732  Blood culture (routine x 2)  BLOOD CULTURE X 2,   STAT     09/30/19 1732          Vitals/Pain Today's Vitals   09/30/19 2200 09/30/19 2230 09/30/19 2300 09/30/19 2330  BP: (!) 133/114 (!) 141/75 (!) 142/66 140/71  Pulse: 97 92 89 85  Resp: (!) 23 (!) 24 (!) 23   Temp:      TempSrc:      SpO2: 97% 96% 99% 98%    Isolation Precautions Airborne and Contact precautions  Medications Medications  acetaminophen (TYLENOL) tablet 650 mg (650 mg Oral Given 09/30/19 2237)    Mobility walks with device

## 2019-10-01 NOTE — Progress Notes (Signed)
  Echocardiogram 2D Echocardiogram has been performed.  Zohair Epp G Lesia Monica 10/01/2019, 11:54 AM

## 2019-10-02 ENCOUNTER — Inpatient Hospital Stay (HOSPITAL_COMMUNITY): Payer: Medicare Other

## 2019-10-02 DIAGNOSIS — U071 COVID-19: Principal | ICD-10-CM

## 2019-10-02 DIAGNOSIS — R7881 Bacteremia: Secondary | ICD-10-CM

## 2019-10-02 DIAGNOSIS — B9561 Methicillin susceptible Staphylococcus aureus infection as the cause of diseases classified elsewhere: Secondary | ICD-10-CM

## 2019-10-02 DIAGNOSIS — J9601 Acute respiratory failure with hypoxia: Secondary | ICD-10-CM

## 2019-10-02 LAB — PROCALCITONIN: Procalcitonin: 0.13 ng/mL

## 2019-10-02 LAB — GLUCOSE, CAPILLARY
Glucose-Capillary: 82 mg/dL (ref 70–99)
Glucose-Capillary: 258 mg/dL — ABNORMAL HIGH (ref 70–99)
Glucose-Capillary: 320 mg/dL — ABNORMAL HIGH (ref 70–99)

## 2019-10-02 LAB — BRAIN NATRIURETIC PEPTIDE: B Natriuretic Peptide: 62.7 pg/mL (ref 0.0–100.0)

## 2019-10-02 MED ORDER — POLYETHYLENE GLYCOL 3350 17 G PO PACK: 17.0000 g | PACK | Freq: Every day | ORAL | Status: DC | PRN

## 2019-10-02 MED ORDER — METHYLPREDNISOLONE SODIUM SUCC 40 MG IJ SOLR
40.0000 mg | Freq: Every day | INTRAMUSCULAR | Status: DC
  Administered 2019-10-02 – 2019-10-05 (×4): 40 mg via INTRAVENOUS
  Filled 2019-10-02 (×4): qty 1

## 2019-10-02 MED ORDER — VANCOMYCIN HCL IN DEXTROSE 1-5 GM/200ML-% IV SOLN
1000.0000 mg | INTRAVENOUS | Status: DC
  Administered 2019-10-03: 1000 mg via INTRAVENOUS
  Filled 2019-10-02: qty 200

## 2019-10-02 MED ORDER — SENNOSIDES-DOCUSATE SODIUM 8.6-50 MG PO TABS: 2.0000 | ORAL_TABLET | Freq: Every evening | ORAL | Status: DC | PRN

## 2019-10-02 MED ORDER — HYDRALAZINE HCL 20 MG/ML IJ SOLN: 10.0000 mg | INTRAMUSCULAR | Status: DC | PRN

## 2019-10-02 MED ORDER — VANCOMYCIN HCL 10 G IV SOLR
1750.0000 mg | Freq: Once | INTRAVENOUS | Status: AC
  Administered 2019-10-02: 1750 mg via INTRAVENOUS
  Filled 2019-10-02: qty 1750

## 2019-10-02 MED ORDER — IOHEXOL 350 MG/ML SOLN
100.0000 mL | Freq: Once | INTRAVENOUS | Status: AC | PRN
  Administered 2019-10-02: 100 mL via INTRAVENOUS

## 2019-10-02 MED ORDER — IPRATROPIUM-ALBUTEROL 20-100 MCG/ACT IN AERS
1.0000 | INHALATION_SPRAY | Freq: Four times a day (QID) | RESPIRATORY_TRACT | Status: DC
  Administered 2019-10-02 – 2019-10-06 (×17): 1 via RESPIRATORY_TRACT
  Filled 2019-10-02: qty 4

## 2019-10-02 NOTE — Progress Notes (Addendum)
PROGRESS NOTE    Monique King  [id number] DOB: March 02, 1933 DOA: 09/30/2019 PCP: Reymundo Poll, MD   Brief Narrative:  85 year old with history of breast cancer [facility] to cough, fevers and hypoxia.  She was diagnosed with COVID-19 and transferred to [facility].  Significantly elevated D-dimer, attempted CTA chest but IV infiltrated.  Has erythema for left NT decubitus area.   Assessment & Plan:   Principal Problem:   Acute respiratory failure with hypoxia (HCC) Active Problems:   Cardiac murmur   Fever   Thrombocytopenia (HCC)   COVID-19 virus infection   Lab test positive for detection of COVID-19 virus  Acute hypoxic respiratory failure secondary to COVID-19 pneumonia -Oxygen levels-3L [place] -Remdesivir-day 2 -Solu-Medrol IV -trend routine Covid related labs -Significantly elevated D-dimer-subcu Lovenox twice daily -Vitamin C & Zinc. Prone >16hrs/day.  -Check BNP and procalcitonin -Chest x-ray-fairly clear on admission -Supportive care-antitussive, inhalers, I-S/flutter -CODE STATUS confirmed  Staph aureus bacteremia -Dr. Linus Salmons started Vanc.  Appreciate his assistance.  We will continue to monitor.  Thrombocytopenia -Secondary to underlying acute condition.  But due to significantly elevated D-dimer, will start her on Lovenox 40 mg twice daily.  Will attempt to obtain CTA chest.  Left ankle decubitus erythema -Secondary to IV infiltration  Moderate aortic valve stenosis Congestive heart failure with preserved ejection fraction, grade 2 diastolic dysfunction -Echocardiogram-ejection fraction 60 to 65% with moderate aortic valve stenosis -Monitor volume status.  Dementia, poor historian  DVT prophylaxis: Subcu Lovenox twice daily Code Status: DNR per [name].  Family Communication:  Paulino Door; updated.  Disposition Plan: Hospital stay until hypoxia has improved and she is complete her treatment with IV remdesivir.    Subjective: Sitting up eating breakfast, forgetful overall. No other complaints.   [name] confirms she is currently at baseline mental status.   Review of Systems Otherwise negative except as per HPI, including: General = no fevers, chills, dizziness, malaise, fatigue HEENT/EYES = negative for pain, redness, loss of vision, double vision, blurred vision, loss of hearing, sore throat, hoarseness, dysphagia Cardiovascular= negative for chest pain, palpitation, murmurs, lower extremity swelling Respiratory/lungs= negative for shortness of breath, cough, hemoptysis, wheezing, mucus production Gastrointestinal= negative for nausea, vomiting,, abdominal pain, melena, hematemesis Genitourinary= negative for Dysuria, Hematuria, Change in Urinary Frequency MSK = Negative for arthralgia, myalgias, Back Pain, Joint swelling  Neurology= Negative for headache, seizures, numbness, tingling  Psychiatry= Negative for anxiety, depression, suicidal and homocidal ideation Allergy/Immunology= Medication/Food allergy as listed  Skin= Negative for Rash, lesions, ulcers, itching  Objective: Vitals:   10/02/19 0100 10/02/19 0200 10/02/19 0300 10/02/19 0400  BP:    100/64  Pulse: (!) 58 (!) 58 61 65  Resp: 18 18 20 20   Temp:    98.9 F (37.2 C)  TempSrc:    Oral  SpO2: 98% 98% 99% 98%  Weight:      Height:        Intake/Output Summary (Last 24 hours) at 10/02/2019 0733 Last data filed at 10/01/2019 2018 Gross per 24 hour  Intake 340 ml  Output 750 ml  Net -410 ml   Filed Weights   10/01/19 1034  Weight: 85.5 kg    Examination:  General exam: Appears calm and comfortable  Respiratory system: some bibasilar crackles.  Cardiovascular system: S1 & S2 heard, RRR. No JVD, murmurs, rubs, gallops or clicks. No pedal edema. Gastrointestinal system: Abdomen is nondistended, soft and nontender. No organomegaly or masses felt. Normal bowel sounds heard. Central nervous system: Alert  and oriented. No  focal neurological deficits. Extremities: Symmetric 4+ x 5 power. Skin: No rashes, lesions or ulcers Psychiatry: Judgement and insight appear normal. Mood & affect appropriate. AAOx2 (baseline)  Data Reviewed:   CBC: Recent Labs  Lab 09/30/19 1728 10/01/19 0435  WBC 3.4* 4.1  NEUTROABS 2.9  --   HGB 12.5 11.2*  HCT 38.9 35.5*  MCV 100.3* 100.9*  PLT 89* [id number]*   Basic Metabolic Panel: Recent Labs  Lab 09/30/19 1911 10/01/19 0435  NA 138 139  K 3.8 3.9  CL 106 104  CO2 24 26  GLUCOSE 163* 144*  BUN 15 11  CREATININE 0.67 0.64  CALCIUM 7.9* 8.2*   GFR: Estimated Creatinine Clearance: 56.5 mL/min (by C-G formula based on SCr of 0.64 mg/dL). Liver Function Tests: Recent Labs  Lab 09/30/19 1911 10/01/19 0435  AST 21 21  ALT 19 17  ALKPHOS 120 115  BILITOT 0.6 0.8  PROT 6.6 6.4*  ALBUMIN 3.7 3.6   No results for input(s): LIPASE, AMYLASE in the last 168 hours. No results for input(s): AMMONIA in the last 168 hours. Coagulation Profile: No results for input(s): INR, PROTIME in the last 168 hours. Cardiac Enzymes: Recent Labs  Lab 10/01/19 0435  CKTOTAL 62  CKMB 1.0   BNP (last 3 results) No results for input(s): PROBNP in the last 8760 hours. HbA1C: No results for input(s): HGBA1C in the last 72 hours. CBG: Recent Labs  Lab 10/01/19 1011 10/01/19 1159 10/01/19 1632 10/01/19 2021  GLUCAP 144* 162* 264* 212*   Lipid Profile: No results for input(s): CHOL, HDL, LDLCALC, TRIG, CHOLHDL, LDLDIRECT in the last 72 hours. Thyroid Function Tests: No results for input(s): TSH, T4TOTAL, FREET4, T3FREE, THYROIDAB in the last 72 hours. Anemia Panel: No results for input(s): VITAMINB12, FOLATE, FERRITIN, TIBC, IRON, RETICCTPCT in the last 72 hours. Sepsis Labs: Recent Labs  Lab 09/30/19 1728 09/30/19 1911  LATICACIDVEN 1.4 1.1    Recent Results (from the past 240 hour(s))  Blood culture (routine x 2)     Status: None (Preliminary result)   Collection  Time: 09/30/19  5:28 PM   Specimen: BLOOD LEFT HAND  Result Value Ref Range Status   Specimen Description   Final    BLOOD LEFT HAND Performed at [facility], [place] [address] [place], [place] [address]    Special Requests   Final    BOTTLES DRAWN AEROBIC AND ANAEROBIC Blood Culture adequate volume Performed at [facility], [place] [address] [place], [place] [address]    Culture  Setup Time   Final    GRAM POSITIVE COCCI IN CLUSTERS IN BOTH AEROBIC AND ANAEROBIC BOTTLES CRITICAL RESULT CALLED TO, READ BACK BY AND VERIFIED WITH: PHARMD [place] [id number] FCP   Performed at [facility], [place] [address], [place], [place] [address]    Culture [facility] POSITIVE COCCI  Final   Report Status PENDING  Incomplete  SARS CORONAVIRUS 2 (TAT 6-24 HRS) Nasopharyngeal Nasopharyngeal Swab     Status: Abnormal   Collection Time: 09/30/19  6:28 PM   Specimen: Nasopharyngeal Swab  Result Value Ref Range Status   SARS Coronavirus 2 POSITIVE (A) NEGATIVE Final    Comment: RESULT CALLED TO, READ BACK BY AND VERIFIED WITH: C.RAZILLE RN [id number] 10/01/2019 MCCORMICK K (NOTE) SARS-CoV-2 target nucleic acids are DETECTED. The SARS-CoV-2 RNA is generally detectable in upper and lower respiratory specimens during the acute phase of infection. Positive results are indicative  of active infection with SARS-CoV-2. Clinical  correlation with patient history and other diagnostic information is necessary to determine patient infection status. Positive results do  not rule out bacterial infection or co-infection with other viruses. The expected result is Negative. Fact Sheet for Patients: SugarRoll.be Fact Sheet for Healthcare Providers: https://www.woods-mathews.com/ This test is not yet approved or cleared by the [place] FDA and  has been authorized for detection and/or diagnosis of SARS-CoV-2 by FDA under an  Emergency Use Authorization (EUA). This EUA will remain  in effect (meaning this test can be used)  for the duration of the COVID-19 declaration under Section 564(b)(1) of the Act, 21 U.S.C. section 360bbb-3(b)(1), unless the authorization is terminated or revoked sooner. Performed at [facility], [place] [address], [place], [place] [address]   Blood culture (routine x 2)     Status: None (Preliminary result)   Collection Time: 09/30/19  7:11 PM   Specimen: BLOOD LEFT ARM  Result Value Ref Range Status   Specimen Description   Final    BLOOD LEFT ARM Performed at [facility], [place] [address], [place], [place] [address]    Special Requests   Final    BOTTLES DRAWN AEROBIC ONLY Blood Culture adequate volume Performed at [facility], [address] [place], [place] [address]    Culture   Final    NO GROWTH < 24 HOURS Performed at [place] [address], [place], [place] [address]    Report Status PENDING  Incomplete  MRSA PCR Screening     Status: None   Collection Time: 10/01/19 10:32 AM   Specimen: Nasal Mucosa; Nasopharyngeal  Result Value Ref Range Status   MRSA by PCR NEGATIVE NEGATIVE Final    Comment:        The GeneXpert MRSA Assay (FDA approved for NASAL specimens only), is one component of a comprehensive MRSA colonization surveillance program. It is not intended to diagnose MRSA infection nor to guide or monitor treatment for MRSA infections. Performed at [facility], [place] [address], [place], [place] [address]          Radiology Studies: Dg Chest Portable 1 View  Result Date: 09/30/2019 CLINICAL DATA:  Cough and shortness of breath EXAM: PORTABLE CHEST 1 VIEW COMPARISON:  None. FINDINGS: Cardiac shadows within normal limits. Tortuous thoracic aorta is noted. Lungs are hypoinflated with mild crowding of the vascular markings. No focal confluent infiltrate is seen. Prior  clavicular fracture is noted with some dystrophic calcification. No acute bony abnormality is noted. IMPRESSION: No acute abnormality noted. Electronically Signed   By: Inez Catalina M.D.   On: 09/30/2019 19:51        Scheduled Meds: . dexamethasone (DECADRON) injection  6 mg Intravenous Daily  . docusate sodium  100 mg Oral Daily  . enoxaparin (LOVENOX) injection  40 mg Subcutaneous BID  . fentaNYL  1 patch Transdermal Q72H  . [START ON 10/03/2019] ferrous sulfate  325 mg Oral Once per day on Mon Wed Fri  . furosemide  20 mg Oral Daily  . insulin aspart  0-5 Units Subcutaneous QHS  . insulin aspart  0-9 Units Subcutaneous TID WC  . simvastatin  10 mg Oral q1800  . traZODone  50 mg Oral QHS   Continuous Infusions: . remdesivir 100 mg in NS 250 mL       LOS: 1 day   Time spent= 35 mins   Arsenio Loader, MD [facility]  If 7PM-7AM, please contact night-coverage  10/02/2019, 7:33 AM

## 2019-10-02 NOTE — Progress Notes (Signed)
Rn spoke with daughter on the phone. Pt. facetime and talked to daughter. No further concerns

## 2019-10-02 NOTE — Progress Notes (Signed)
Pharmacy Antibiotic Note  Monique King is a 85 y.o. female admitted on 09/30/2019 with staphylococcus aureus bacteremia.  Pharmacy has been consulted by Infectious Disease for Vancomycin dosing. Currently staph aureus is growing in 1 out of 2 blood cultures. WBC is within normal limits. Patient was febrile on admission and is COVID + receiving Remdesivir therapy. Currently, she no longer has a fever. Her renal function is within normal limits.   Plan: Vancomycin 1750 mg IV x1 then 1000 mg IV every 24 hours.  Goal AUC 400-550. Expected AUC: 537 SCr used: 0.8 Will monitor renal function, culture results, and clinical status.   Height: 5\' 5"  (165.1 cm) Weight: 188 lb 7.9 oz (85.5 kg) IBW/kg (Calculated) : 57  Temp (24hrs), Avg:98.3 F (36.8 C), Min:97.7 F (36.5 C), Max:98.9 F (37.2 C)  Recent Labs  Lab 09/30/19 1728 09/30/19 1911 10/01/19 0435  WBC 3.4*  --  4.1  CREATININE  --  0.67 0.64  LATICACIDVEN 1.4 1.1  --     Estimated Creatinine Clearance: 56.5 mL/min (by C-G formula based on SCr of 0.64 mg/dL).    No Known Allergies  Antimicrobials this admission: Vancomycin 11/15 >>  Dose adjustments this admission:   Microbiology results: 11/13 BCx: 1 out of 2 Staph Aureus 11/13 UCx: sent 11/14 MRSA PCR: neg  Thank you for allowing pharmacy to be a part of this patient's care.  Sloan Leiter, PharmD, BCPS, BCCCP Clinical Pharmacist Please refer to [facility] for [place] numbers 10/02/2019 12:18 PM

## 2019-10-02 NOTE — Progress Notes (Signed)
Notified daughter of progress.  All questions were answered and this nurse's contact number shared for further communication.   

## 2019-10-02 NOTE — Consult Note (Signed)
[facility] for Infectious Disease       Reason for Consult: Staph aureus bacteremia    Referring Physician: CHAMP autoconsult  Principal Problem:   Acute respiratory failure with hypoxia ([place]) Active Problems:   Cardiac murmur   Fever   Thrombocytopenia ([place])   COVID-19 virus infection   Lab test positive for detection of COVID-19 virus   . docusate sodium  100 mg Oral Daily  . enoxaparin (LOVENOX) injection  40 mg Subcutaneous BID  . fentaNYL  1 patch Transdermal Q72H  . [START ON 10/03/2019] ferrous sulfate  325 mg Oral Once per day on Mon Wed Fri  . furosemide  20 mg Oral Daily  . insulin aspart  0-5 Units Subcutaneous QHS  . insulin aspart  0-9 Units Subcutaneous TID WC  . Ipratropium-Albuterol  1 puff Inhalation Q6H  . methylPREDNISolone (SOLU-MEDROL) injection  40 mg Intravenous Daily  . simvastatin  10 mg Oral q1800  . traZODone  50 mg Oral QHS    Recommendations: Vancomycin pending sensitivities/BCID Repeat blood cultures  TEE when able   Assessment: She came in with predominately respiratory symptoms including hypoxia and COVID-19 positive but now with Staph aureus in 1 set of blood cultures to date.  It is hard to know if this is the underlying process of her fever.  Her CXR does not look c/w viral infection and more vascular markings, ? Vascular congestion related to her AV disease and diastolic dysfunction. I have added vancomycin and she is continuing on COVID-19 treatment.  Also elevated D dimer and to get a CTA.    Dr. Tommy Medal will be back on at [facility] tomorrow.      Antibiotics: Vancomycin starting  HPI: Monique King is a 85 y.o. female resident of [place] who came in 11/13 with cough, fever and hypoxia.  She has not offered much history herself.  CXR showed vascular markings as above and some hypoxia and remains on nasal cannula.  Patient is seen remotely due to COVID-19.   CXR independently reviewed and no multifocal opacities.    Review of Systems:   Past Medical History:  Diagnosis Date  . Breast cancer [facility])     Social History   Tobacco Use  . Smoking status: Never Smoker  . Smokeless tobacco: Never Used  Substance Use Topics  . Alcohol use: Never    Frequency: Never  . Drug use: Not on file    History reviewed. No pertinent family history.  Per chart  No Known Allergies  Physical Exam:  Vitals:   10/02/19 0400 10/02/19 0700  BP: 100/64 124/62  Pulse: 65 72  Resp: 20 19  Temp: 98.9 F (37.2 C) 98.2 F (36.8 C)  SpO2: 98% 99%    Lab Results  Component Value Date   WBC 4.1 10/01/2019   HGB 11.2 (L) 10/01/2019   HCT 35.5 (L) 10/01/2019   MCV 100.9 (H) 10/01/2019   PLT 106 (L) 10/01/2019    Lab Results  Component Value Date   CREATININE 0.64 10/01/2019   BUN 11 10/01/2019   NA 139 10/01/2019   K 3.9 10/01/2019   CL 104 10/01/2019   CO2 26 10/01/2019    Lab Results  Component Value Date   ALT 17 10/01/2019   AST 21 10/01/2019   ALKPHOS 115 10/01/2019     Microbiology: Recent Results (from the past 240 hour(s))  Blood culture (routine x 2)     Status: Abnormal (Preliminary result)  Collection Time: 09/30/19  5:28 PM   Specimen: BLOOD LEFT HAND  Result Value Ref Range Status   Specimen Description   Final    BLOOD LEFT HAND Performed at [facility], [address], [place], [place] [address]    Special Requests   Final    BOTTLES DRAWN AEROBIC AND ANAEROBIC Blood Culture adequate volume Performed at [facility], [place] [address], [place], [place] [address]    Culture  Setup Time   Final    GRAM POSITIVE COCCI IN CLUSTERS IN BOTH AEROBIC AND ANAEROBIC BOTTLES CRITICAL RESULT CALLED TO, READ BACK BY AND VERIFIED WITH: PHARMD [place] [id number] FCP   Performed at [facility], [place] [address] [place], [place] [address]    Culture STAPHYLOCOCCUS AUREUS (A)  Final   Report Status PENDING  Incomplete  SARS  CORONAVIRUS 2 (TAT 6-24 HRS) Nasopharyngeal Nasopharyngeal Swab     Status: Abnormal   Collection Time: 09/30/19  6:28 PM   Specimen: Nasopharyngeal Swab  Result Value Ref Range Status   SARS Coronavirus 2 POSITIVE (A) NEGATIVE Final    Comment: RESULT CALLED TO, READ BACK BY AND VERIFIED WITH: C.RAZILLE RN [id number] 10/01/2019 MCCORMICK K (NOTE) SARS-CoV-2 target nucleic acids are DETECTED. The SARS-CoV-2 RNA is generally detectable in upper and lower respiratory specimens during the acute phase of infection. Positive results are indicative of active infection with SARS-CoV-2. Clinical  correlation with patient history and other diagnostic information is necessary to determine patient infection status. Positive results do  not rule out bacterial infection or co-infection with other viruses. The expected result is Negative. Fact Sheet for Patients: SugarRoll.be Fact Sheet for Healthcare Providers: https://www.woods-mathews.com/ This test is not yet approved or cleared by the [place] FDA and  has been authorized for detection and/or diagnosis of SARS-CoV-2 by FDA under an Emergency Use Authorization (EUA). This EUA will remain  in effect (meaning this test can be used)  for the duration of the COVID-19 declaration under Section 564(b)(1) of the Act, 21 U.S.C. section 360bbb-3(b)(1), unless the authorization is terminated or revoked sooner. Performed at [facility], [place] [address], [place], [place] [address]   Blood culture (routine x 2)     Status: None (Preliminary result)   Collection Time: 09/30/19  7:11 PM   Specimen: BLOOD LEFT ARM  Result Value Ref Range Status   Specimen Description   Final    BLOOD LEFT ARM Performed at [facility], [place] [address] [place], [place] [address]    Special Requests   Final    BOTTLES DRAWN AEROBIC ONLY Blood Culture adequate volume Performed at [facility], [address] [place], [place] [address]    Culture   Final    NO GROWTH < 24 HOURS Performed at [place] [address] [place], [place] [address]    Report Status PENDING  Incomplete  MRSA PCR Screening     Status: None   Collection Time: 10/01/19 10:32 AM   Specimen: Nasal Mucosa; Nasopharyngeal  Result Value Ref Range Status   MRSA by PCR NEGATIVE NEGATIVE Final    Comment:        The GeneXpert MRSA Assay (FDA approved for NASAL specimens only), is one component of a comprehensive MRSA colonization surveillance program. It is not intended to diagnose MRSA infection nor to guide or monitor treatment for MRSA infections. Performed at [facility], [place] [address], [place], [place] [address]     Monique King W Zoraya Fiorenza, MD [facility] for Infectious Disease [place] [facility] [email or web address] 10/02/2019, 11:34 AM

## 2019-10-03 ENCOUNTER — Inpatient Hospital Stay (HOSPITAL_COMMUNITY): Payer: Medicare Other

## 2019-10-03 DIAGNOSIS — L538 Other specified erythematous conditions: Secondary | ICD-10-CM

## 2019-10-03 DIAGNOSIS — M7989 Other specified soft tissue disorders: Secondary | ICD-10-CM

## 2019-10-03 DIAGNOSIS — L03113 Cellulitis of right upper limb: Secondary | ICD-10-CM | POA: Insufficient documentation

## 2019-10-03 LAB — COMPREHENSIVE METABOLIC PANEL
ALT: 18 U/L (ref 0–44)
AST: 18 U/L (ref 15–41)
Albumin: 3.5 g/dL (ref 3.5–5.0)
Alkaline Phosphatase: 106 U/L (ref 38–126)
Anion gap: 10 (ref 5–15)
BUN: 23 mg/dL (ref 8–23)
CO2: 25 mmol/L (ref 22–32)
Calcium: 8.5 mg/dL — ABNORMAL LOW (ref 8.9–10.3)
Chloride: 105 mmol/L (ref 98–111)
Creatinine, Ser: 0.77 mg/dL (ref 0.44–1.00)
GFR calc Af Amer: >60 mL/min (ref >60)
GFR calc non Af Amer: >60 mL/min (ref >60)
Glucose, Bld: 109 mg/dL — ABNORMAL HIGH (ref 70–99)
Potassium: 3.4 mmol/L — ABNORMAL LOW (ref 3.5–5.1)
Sodium: 140 mmol/L (ref 135–145)
Total Bilirubin: 0.5 mg/dL (ref 0.3–1.2)
Total Protein: 6.7 g/dL (ref 6.5–8.1)

## 2019-10-03 LAB — CBC
HCT: 38.9 % (ref 36.0–46.0)
Hemoglobin: 12.2 g/dL (ref 12.0–15.0)
MCH: 31.4 pg (ref 26.0–34.0)
MCHC: 31.4 g/dL (ref 30.0–36.0)
MCV: 100.3 fL — ABNORMAL HIGH (ref 80.0–100.0)
Platelets: 127 10*3/uL — ABNORMAL LOW (ref 150–400)
RBC: 3.88 MIL/uL (ref 3.87–5.11)
RDW: 13.2 % (ref 11.5–15.5)
WBC: 2.1 10*3/uL — ABNORMAL LOW (ref 4.0–10.5)
nRBC: 0 % (ref 0.0–0.2)

## 2019-10-03 LAB — CULTURE, BLOOD (ROUTINE X 2): Special Requests: ADEQUATE

## 2019-10-03 LAB — GLUCOSE, CAPILLARY
Glucose-Capillary: 144 mg/dL — ABNORMAL HIGH (ref 70–99)
Glucose-Capillary: 96 mg/dL (ref 70–99)
Glucose-Capillary: 192 mg/dL — ABNORMAL HIGH (ref 70–99)
Glucose-Capillary: 362 mg/dL — ABNORMAL HIGH (ref 70–99)
Glucose-Capillary: 279 mg/dL — ABNORMAL HIGH (ref 70–99)

## 2019-10-03 LAB — FERRITIN: Ferritin: 1294 ng/mL — ABNORMAL HIGH (ref 11–307)

## 2019-10-03 LAB — MAGNESIUM: Magnesium: 1.9 mg/dL (ref 1.7–2.4)

## 2019-10-03 LAB — PROCALCITONIN: Procalcitonin: 0.13 ng/mL

## 2019-10-03 LAB — C-REACTIVE PROTEIN: CRP: 7.5 mg/dL — ABNORMAL HIGH (ref <1.0)

## 2019-10-03 LAB — LACTATE DEHYDROGENASE: LDH: 180 U/L (ref 98–192)

## 2019-10-03 MED ORDER — GERHARDT'S BUTT CREAM
TOPICAL_CREAM | Freq: Two times a day (BID) | CUTANEOUS | Status: DC
  Administered 2019-10-06: 11:00:00 via TOPICAL
  Filled 2019-10-03: qty 1

## 2019-10-03 NOTE — Progress Notes (Signed)
Subjective:  I did not examine pt in person   Antibiotics:  Anti-infectives (From admission, onward)   Start     Dose/Rate Route Frequency Ordered Stop   10/03/19 1200  vancomycin (VANCOCIN) IVPB 1000 mg/200 mL premix     1,000 mg 200 mL/hr over 60 Minutes Intravenous Every 24 hours 10/02/19 1228     10/02/19 1230  vancomycin (VANCOCIN) 1,750 mg in sodium chloride 0.9 % 500 mL IVPB     1,750 mg 250 mL/hr over 120 Minutes Intravenous  Once 10/02/19 1228 10/02/19 1622   10/02/19 1000  remdesivir 100 mg in sodium chloride 0.9 % 250 mL IVPB     100 mg 500 mL/hr over 30 Minutes Intravenous Every 24 hours 10/01/19 0600 10/06/19 0959   10/01/19 0645  remdesivir 200 mg in sodium chloride 0.9 % 250 mL IVPB     200 mg 500 mL/hr over 30 Minutes Intravenous Once 10/01/19 0600 10/01/19 1900      Medications: Scheduled Meds: . docusate sodium  100 mg Oral Daily  . enoxaparin (LOVENOX) injection  40 mg Subcutaneous BID  . fentaNYL  1 patch Transdermal Q72H  . ferrous sulfate  325 mg Oral Once per day on Mon Wed Fri  . furosemide  20 mg Oral Daily  . Gerhardt's butt cream   Topical BID  . insulin aspart  0-5 Units Subcutaneous QHS  . insulin aspart  0-9 Units Subcutaneous TID WC  . Ipratropium-Albuterol  1 puff Inhalation Q6H  . methylPREDNISolone (SOLU-MEDROL) injection  40 mg Intravenous Daily  . simvastatin  10 mg Oral q1800  . traZODone  50 mg Oral QHS   Continuous Infusions: . remdesivir 100 mg in NS 250 mL Stopped (10/03/19 1028)  . vancomycin 1,000 mg (10/03/19 1125)   PRN Meds:.acetaminophen **OR** acetaminophen, diazepam, hydrALAZINE, polyethylene glycol, senna-docusate    Objective: Weight change:   Intake/Output Summary (Last 24 hours) at 10/03/2019 1414 Last data filed at 10/03/2019 0500 Gross per 24 hour  Intake 799.55 ml  Output 1300 ml  Net -500.45 ml   Blood pressure (!) 120/59, pulse 78, temperature 98.3 F (36.8 C), temperature source Oral,  resp. rate 16, height 5\' 5"  (1.651 m), weight 85.5 kg, SpO2 98 %. Temp:  [97.9 F (36.6 C)-98.3 F (36.8 C)] 98.3 F (36.8 C) (11/16 0800) Pulse Rate:  [55-78] 78 (11/16 1000) Resp:  [13-20] 16 (11/16 1000) BP: (107-125)/(57-72) 120/59 (11/16 1000) SpO2:  [91 %-98 %] 98 % (11/16 1000)  Physical Exam:  Vitals:   10/03/19 0800 10/03/19 1000  BP: 122/61 (!) 120/59  Pulse: 67 78  Resp: 17 16  Temp: 98.3 F (36.8 C)   SpO2: 93% 98%     CBC:    BMET Recent Labs    10/01/19 0435 10/03/19 0613  NA 139 140  K 3.9 3.4*  CL 104 105  CO2 26 25  GLUCOSE 144* 109*  BUN 11 23  CREATININE 0.64 0.77  CALCIUM 8.2* 8.5*     Liver Panel  Recent Labs    10/01/19 0435 10/03/19 0613  PROT 6.4* 6.7  ALBUMIN 3.6 3.5  AST 21 18  ALT 17 18  ALKPHOS 115 106  BILITOT 0.8 0.5       Sedimentation Rate Recent Labs    10/01/19 0700  ESRSEDRATE 28*   C-Reactive Protein Recent Labs    10/01/19 0724 10/03/19 0613  CRP 6.4* 7.5*    Micro Results: Recent Results (from  the past 720 hour(s))  Blood culture (routine x 2)     Status: Abnormal   Collection Time: 09/30/19  5:28 PM   Specimen: BLOOD LEFT HAND  Result Value Ref Range Status   Specimen Description   Final    BLOOD LEFT HAND Performed at [facility], [place] [address] [place], [place] [address]    Special Requests   Final    BOTTLES DRAWN AEROBIC AND ANAEROBIC Blood Culture adequate volume Performed at [facility], [place] [address], [place], [place] [address]    Culture  Setup Time   Final    GRAM POSITIVE COCCI IN CLUSTERS IN BOTH AEROBIC AND ANAEROBIC BOTTLES CRITICAL RESULT CALLED TO, READ BACK BY AND VERIFIED WITH: PHARMD [place] [id number] FCP   Performed at [facility], [place] [address] [place], [place] [address]    Culture STAPHYLOCOCCUS AUREUS (A)  Final   Report Status 10/03/2019 FINAL  Final   Organism ID, Bacteria STAPHYLOCOCCUS  AUREUS  Final      Susceptibility   Staphylococcus aureus - MIC*    CIPROFLOXACIN <=0.5 SENSITIVE Sensitive     ERYTHROMYCIN <=0.25 SENSITIVE Sensitive     GENTAMICIN <=0.5 SENSITIVE Sensitive     OXACILLIN <=0.25 SENSITIVE Sensitive     TETRACYCLINE <=1 SENSITIVE Sensitive     VANCOMYCIN <=0.5 SENSITIVE Sensitive     TRIMETH/SULFA <=10 SENSITIVE Sensitive     CLINDAMYCIN <=0.25 SENSITIVE Sensitive     RIFAMPIN <=0.5 SENSITIVE Sensitive     Inducible Clindamycin NEGATIVE Sensitive     * STAPHYLOCOCCUS AUREUS  SARS CORONAVIRUS 2 (TAT 6-24 HRS) Nasopharyngeal Nasopharyngeal Swab     Status: Abnormal   Collection Time: 09/30/19  6:28 PM   Specimen: Nasopharyngeal Swab  Result Value Ref Range Status   SARS Coronavirus 2 POSITIVE (A) NEGATIVE Final    Comment: RESULT CALLED TO, READ BACK BY AND VERIFIED WITH: C.RAZILLE RN [id number] 10/01/2019 MCCORMICK K (NOTE) SARS-CoV-2 target nucleic acids are DETECTED. The SARS-CoV-2 RNA is generally detectable in upper and lower respiratory specimens during the acute phase of infection. Positive results are indicative of active infection with SARS-CoV-2. Clinical  correlation with patient history and other diagnostic information is necessary to determine patient infection status. Positive results do  not rule out bacterial infection or co-infection with other viruses. The expected result is Negative. Fact Sheet for Patients: SugarRoll.be Fact Sheet for Healthcare Providers: https://www.woods-mathews.com/ This test is not yet approved or cleared by the [place] FDA and  has been authorized for detection and/or diagnosis of SARS-CoV-2 by FDA under an Emergency Use Authorization (EUA). This EUA will remain  in effect (meaning this test can be used)  for the duration of the COVID-19 declaration under Section 564(b)(1) of the Act, 21 U.S.C. section 360bbb-3(b)(1), unless the authorization is terminated or  revoked sooner. Performed at [facility], [place] [address], [place], [place] [address]   Blood culture (routine x 2)     Status: None (Preliminary result)   Collection Time: 09/30/19  7:11 PM   Specimen: BLOOD LEFT ARM  Result Value Ref Range Status   Specimen Description   Final    BLOOD LEFT ARM Performed at [facility], [place] [address], [place], [place] [address]    Special Requests   Final    BOTTLES DRAWN AEROBIC ONLY Blood Culture adequate volume Performed at [facility], [address] [place], [place]  [address]    Culture   Final    NO GROWTH 3 DAYS Performed at [facility], [place] [address] [place], [place] [address]    Report Status PENDING  Incomplete  MRSA PCR Screening     Status: None   Collection Time: 10/01/19 10:32 AM   Specimen: Nasal Mucosa; Nasopharyngeal  Result Value Ref Range Status   MRSA by PCR NEGATIVE NEGATIVE Final    Comment:        The GeneXpert MRSA Assay (FDA approved for NASAL specimens only), is one component of a comprehensive MRSA colonization surveillance program. It is not intended to diagnose MRSA infection nor to guide or monitor treatment for MRSA infections. Performed at [facility], [place] [address], [place], [place] [address]   Culture, blood (routine x 2)     Status: None (Preliminary result)   Collection Time: 10/03/19  6:13 AM   Specimen: BLOOD  Result Value Ref Range Status   Specimen Description BLOOD LEFT ANTECUBITAL  Final   Special Requests AEROBIC BOTTLE ONLY Blood Culture adequate volume  Final   Culture   Final    NO GROWTH <12 HOURS Performed at [facility], [place] [address], [place], [place] [address]    Report Status PENDING  Incomplete  Culture, blood (routine x 2)     Status: None (Preliminary result)   Collection Time: 10/03/19  6:14 AM   Specimen: BLOOD LEFT HAND  Result Value Ref Range Status   Specimen Description    Final    BLOOD LEFT HAND Performed at [facility], [place] [address] [place], [place] [address]    Special Requests   Final    NONE Performed at [facility], [place] [address], [place], [place] [address]    Culture   Final    NO GROWTH <12 HOURS Performed at [place] [address] [place], [place] [address]    Report Status PENDING  Incomplete    Studies/Results: Ct Angio Chest Pe W Or Wo Contrast  Result Date: 10/02/2019 CLINICAL DATA:  Chest pain EXAM: CT ANGIOGRAPHY CHEST WITH CONTRAST TECHNIQUE: Multidetector CT imaging of the chest was performed using the standard protocol during bolus administration of intravenous contrast. Multiplanar CT image reconstructions and MIPs were obtained to evaluate the vascular anatomy. CONTRAST:  145mL OMNIPAQUE IOHEXOL 350 MG/ML SOLN COMPARISON:  None. FINDINGS: Cardiovascular: Satisfactory opacification of the pulmonary arteries to the segmental level. No evidence of pulmonary embolism. Normal heart size. Scattered coronary artery calcifications. No pericardial effusion. Aortic atherosclerosis. 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 Mediastinum/Nodes: No enlarged mediastinal, hilar, or axillary lymph nodes. Thyroid gland, trachea, and esophagus demonstrate no significant findings. Lungs/Pleura: Trace bilateral pleural effusions. There are scattered, nonspecific bilateral ground-glass opacities, most numerous in the right middle lobe (series 6, image 73). There is bandlike scarring of the lung bases. Subpleural fibrotic scarring of the anterior apex (series 6, image 20). Upper Abdomen: No acute abnormality. Musculoskeletal: Status post radical right mastectomy. Osteopenia. Chronic appearing fracture deformity of the proximal right humerus. Review of the MIP images confirms the above findings. IMPRESSION: 1.  Negative examination for pulmonary embolism. 2. There are scattered,  nonspecific bilateral ground-glass opacities, most numerous in the right middle lobe (series 6, image 73), infectious or inflammatory. 3.  Trace bilateral pleural effusions. 4. Postoperative and post radiation findings of right breast malignancy. 5.  Coronary artery disease.  Aortic Atherosclerosis (ICD10-I70.0).  Electronically Signed   By: Eddie Candle M.D.   On: 10/02/2019 12:48   Vas [place] Upper Extremity Venous Duplex  Result Date: 10/03/2019 UPPER VENOUS STUDY  Indications: Edema, and Patient has had right arm lymphedema since 1974, status post mastectomy Limitations: Edema, patient unable to move arm. Comparison Study: No prior study on file Performing Technologist: Sharion Dove RVS  Examination Guidelines: A complete evaluation includes B-mode imaging, spectral Doppler, color Doppler, and power Doppler as needed of all accessible portions of each vessel. Bilateral testing is considered an integral part of a complete examination. Limited examinations for reoccurring indications may be performed as noted.  Right Findings: +----------+------------+---------+-----------+----------+-------+ RIGHT     CompressiblePhasicitySpontaneousPropertiesSummary +----------+------------+---------+-----------+----------+-------+ IJV           Full       Yes       Yes                      +----------+------------+---------+-----------+----------+-------+ Subclavian    Full       Yes       Yes                      +----------+------------+---------+-----------+----------+-------+ Axillary                 Yes       Yes                      +----------+------------+---------+-----------+----------+-------+ Brachial                 Yes       Yes                      +----------+------------+---------+-----------+----------+-------+ Cephalic      Full                                          +----------+------------+---------+-----------+----------+-------+ Basilic       Full                                           +----------+------------+---------+-----------+----------+-------+  Left Findings: +----------+------------+---------+-----------+----------+-------+ LEFT      CompressiblePhasicitySpontaneousPropertiesSummary +----------+------------+---------+-----------+----------+-------+ Subclavian               Yes       Yes                      +----------+------------+---------+-----------+----------+-------+  Summary:  Right: No evidence of deep vein thrombosis in the upper extremity. However, unable to visualize the radial ulnar. No evidence of superficial vein thrombosis in the upper extremity. However, unable to visualize the radial ulnar. No evidence of thrombosis in the . However, unable to visualize the radial ulnar. This was a limited study.  Left: No evidence of thrombosis in the subclavian.  *See table(s) above for measurements and observations.    Preliminary       Assessment/Plan:  INTERVAL HISTORY:  Repeat blood cultures taken   Principal Problem:   Acute respiratory failure with hypoxia (HCC) Active Problems:   Cardiac murmur   Fever   Thrombocytopenia (HCC)   COVID-19 virus infection   Lab test positive for detection of COVID-19 virus   Bacteremia due to Staphylococcus aureus  Cellulitis of right upper extremity    Monique King is a 85 y.o. female with  Hx of breast cancer admitted with COVID-19 pnejumonia but has also now been found to have MSSA in 1/2 blood cultures  #1 MSSA bacteremia:        Antimicrobial Management Team Staphylococcus aureus bacteremia   Staphylococcus aureus bacteremia (SAB) is associated with a high rate of complications and mortality.  Specific aspects of clinical management are critical to optimizing the outcome of patients with SAB.  Therefore, the [facility] Antimicrobial Management Team [facility]) has initiated an intervention aimed at improving the management of SAB at [facility].  To do  so, Infectious Diseases physicians are providing an evidence-based consult for the management of all patients with SAB.     Yes No Comments  Perform follow-up blood cultures (even if the patient is afebrile) to ensure clearance of bacteremia [x]  []  Repeat blood cultures taken  Remove vascular catheter and obtain follow-up blood cultures after the removal of the catheter []  []  DO NOT PLACE LINE  Perform echocardiography to evaluate for endocarditis (transthoracic ECHO is 40-50% sensitive, TEE is > 90% sensitive) []  []  Please keep in mind, that neither test can definitively EXCLUDE endocarditis, and that should clinical suspicion remain high for endocarditis the patient should then still be treated with an "endocarditis" duration of therapy = 6 weeks TTE done but TEE not going to be done at [place] electrophysiologist to evaluate implanted cardiac device (pacemaker, ICD) []  []    Ensure source control []  []  Have all abscesses been drained effectively? Have deep seeded infections (septic joints or osteomyelitis) had appropriate surgical debridement?  Investigate for "metastatic" sites of infection []  []  Does the patient have ANY symptom or physical exam finding that would suggest a deeper infection (back or neck pain that may be suggestive of vertebral osteomyelitis or epidural abscess, muscle pain that could be a symptom of pyomyositis)?  Keep in mind that for deep seeded infections MRI imaging with contrast is preferred rather than other often insensitive tests such as plain x-rays, especially early in a patient's presentation.  Change antibiotic therapy to cefazolin []  []  Beta-lactam antibiotics are preferred for MSSA due to higher cure rates.   If on Vancomycin, goal trough should be 15 - 20 mcg/mL  Estimated duration of IV antibiotic therapy:  4-6 weeks []  []  Consult case management for probably prolonged outpatient IV antibiotic therapy        LOS: 2 days   Alcide Evener 10/03/2019,  2:14 PM

## 2019-10-03 NOTE — Progress Notes (Signed)
Pharmacy Antibiotic Note  Monique King is a 85 y.o. female admitted on 09/30/2019 with COVID-19, hypoxia, and staphylococcus aureus bacteremia, RUE cellulitis.  Pharmacy has been consulted by Infectious Disease for cefazolin dosing.  Currently staph aureus is growing in 1 out of 2 blood cultures, WBC low, Afebrile.  Plan: Cefazolin 2g IV q8h. Follow up renal function, culture results, and clinical course.  Height: 5\' 5"  (165.1 cm) Weight: 188 lb 7.9 oz (85.5 kg) IBW/kg (Calculated) : 57  Temp (24hrs), Avg:98.1 F (36.7 C), Min:97.9 F (36.6 C), Max:98.3 F (36.8 C)  Recent Labs  Lab 09/30/19 1728 09/30/19 1911 10/01/19 0435 10/03/19 0613  WBC 3.4*  --  4.1 2.1*  CREATININE  --  0.67 0.64 0.77  LATICACIDVEN 1.4 1.1  --   --     Estimated Creatinine Clearance: 56.5 mL/min (by C-G formula based on SCr of 0.77 mg/dL).    No Known Allergies  Antimicrobials this admission: Vancomycin 11/15 >>11/16 Cefazolin 11/16 >>   Dose adjustments this admission:   Microbiology results: 11/13 SARS CoV2: postivie 11/13 BCx: 2/4 bottles Staph Aureus (pan-sens) 11/13 UCx: sent 11/14 MRSA PCR: neg 11/16 BCx: ngtd  Thank you for allowing pharmacy to be a part of this patient's care.  Gretta Arab PharmD, BCPS Clinical pharmacist phone 7am- 5pm: [phone] 10/03/2019 2:27 PM

## 2019-10-03 NOTE — Evaluation (Signed)
Occupational Therapy Evaluation Patient Details Name: Monique King MRN: [id number] DOB: 02/01/1933 Today's Date: 10/03/2019    History of Present Illness Pt adm with acute respiratory failure due to covid PNA. PMH - rt mastectomy with resultant RUE lymphadema and nonfunctioning RUE, dementia, obesity, DM, rt carpal tunnel, rt rotator cuff tear, rt knee arthritis   Clinical Impression   This 85 y/o female presents with the above. PTA pt residing in ALF, was able to mobilize short distances using quad cane but mostly using w/c, receiving assist for bathing/dressing but was able to perform toileting with mod independence. Pt pleasantly confused this session with notable STM deficits. Pt requiring two person assist for safe completion of functional transfers and use of Stedy for transfer OOB this session. Currently requiring modA for UB ADL and max-totalA for LB ADL. Pt with flaccid RUE and notable edema. VSS throughout on RA. She will benefit from continued acute OT services while admitted, if ALF able to provide necessary assist at time of discharge (and able to take covid+ pt's) recommend follow up [facility]. If facility unable to take pt at current level will need SNF. Acute OT to follow.     Follow Up Recommendations  Home health OT;SNF;Supervision/Assistance - 24 hour(HH vs SNF, pending ALF able to provide assist)    Equipment Recommendations  3 in 1 bedside commode           Precautions / Restrictions Precautions Precautions: Fall Restrictions Weight Bearing Restrictions: No      Mobility Bed Mobility Overal bed mobility: Needs Assistance Bed Mobility: Supine to Sit     Supine to sit: +2 for physical assistance;Max assist     General bed mobility comments: Assist to bring legs off of bed, elevate trunk into sitting, and bring hips to EOB.  Transfers Overall transfer level: Needs assistance Equipment used: 2 person hand held assist;Ambulation equipment  used Transfers: Sit to/from Stand Sit to Stand: +2 physical assistance;Max assist         General transfer comment: Assist to bring hips up and for balance. Used Stedy for bed to recliner.     Balance Overall balance assessment: Needs assistance Sitting-balance support: Single extremity supported;Feet supported Sitting balance-Leahy Scale: Poor Sitting balance - Comments: UE support and min to min guard assist                                   ADL either performed or assessed with clinical judgement   ADL Overall ADL's : Needs assistance/impaired Eating/Feeding: Set up;Sitting Eating/Feeding Details (indicate cue type and reason): setup to open containers, cut up food Grooming: Set up;Minimal assistance;Sitting Grooming Details (indicate cue type and reason): supported sitting Upper Body Bathing: Moderate assistance;Sitting   Lower Body Bathing: Maximal assistance;+2 for physical assistance;+2 for safety/equipment;Sit to/from stand   Upper Body Dressing : Moderate assistance;Sitting   Lower Body Dressing: Maximal assistance;+2 for physical assistance;+2 for safety/equipment;Sit to/from stand       Toileting- Water quality scientist and Hygiene: Total assistance;+2 for physical assistance;+2 for safety/equipment;Sit to/from stand       Functional mobility during ADLs: Maximal assistance;+2 for physical assistance;+2 for safety/equipment(sit<>stand)                           Pertinent Vitals/Pain Pain Assessment: No/denies pain     Hand Dominance Right(R at baseline, has had to use LUE dominantly)  Extremity/Trunk Assessment Upper Extremity Assessment Upper Extremity Assessment: RUE deficits/detail RUE Deficits / Details: with significant history of RUE lymphedema, pt with flaccid and edemous UE today RUE Sensation: decreased light touch RUE Coordination: decreased fine motor;decreased gross motor   Lower Extremity Assessment Lower Extremity  Assessment: Defer to PT evaluation RLE Deficits / Details: Decr knee flexion ROM       Communication Communication Communication: No difficulties   Cognition Arousal/Alertness: Awake/alert Behavior During Therapy: WFL for tasks assessed/performed Overall Cognitive Status: No family/caregiver present to determine baseline cognitive functioning                                 General Comments: Pt unable to relay information for prior level of function. Pt knew she was in hospital but unaware why. Followed 1 step commands. Poor memory.   General Comments  VSS    Exercises     Shoulder Instructions      Home Living Family/patient expects to be discharged to:: Assisted living                             Home Equipment: Cane - quad;Wheelchair - manual          Prior Functioning/Environment Level of Independence: Needs assistance  Gait / Transfers Assistance Needed: Per daughter pt amb short distances in room with quad cane. Used w/c primarily.  ADL's / Homemaking Assistance Needed: Modified independent for toileting. Assist for bathing/dressing            OT Problem List: Decreased strength;Decreased range of motion;Decreased activity tolerance;Impaired balance (sitting and/or standing);Decreased cognition;Decreased safety awareness;Decreased knowledge of use of DME or AE;Cardiopulmonary status limiting activity;Impaired UE functional use;Increased edema;Obesity;Impaired sensation      OT Treatment/Interventions: Self-care/ADL training;Therapeutic exercise;DME and/or AE instruction;Therapeutic activities;Cognitive remediation/compensation;Patient/family education;Balance training;Energy conservation    OT Goals(Current goals can be found in the care plan section) Acute Rehab OT Goals Patient Stated Goal: Daughter would like for pt to return to ALF if possible OT Goal Formulation: With patient/family Time For Goal Achievement: 10/17/19 Potential to  Achieve Goals: Good  OT Frequency: Min 2X/week   Barriers to D/C:            Co-evaluation PT/OT/SLP Co-Evaluation/Treatment: Yes Reason for Co-Treatment: For patient/therapist safety;To address functional/ADL transfers PT goals addressed during session: Mobility/safety with mobility;Balance OT goals addressed during session: ADL's and self-care      AM-PAC OT "6 Clicks" Daily Activity     Outcome Measure Help from another person eating meals?: A Little Help from another person taking care of personal grooming?: A Little Help from another person toileting, which includes using toliet, bedpan, or urinal?: Total Help from another person bathing (including washing, rinsing, drying)?: A Lot Help from another person to put on and taking off regular upper body clothing?: A Lot Help from another person to put on and taking off regular lower body clothing?: Total 6 Click Score: 12   End of Session Equipment Utilized During Treatment: Gait belt Nurse Communication: Mobility status;Need for lift equipment  Activity Tolerance: Patient tolerated treatment well Patient left: in chair;with call bell/phone within reach;with chair alarm set  OT Visit Diagnosis: Muscle weakness (generalized) (M62.81);Unsteadiness on feet (R26.81);Other symptoms and signs involving cognitive function                Time: 1244-1305 OT Time Calculation (min): 21 min Charges:  OT General Charges $OT Visit: 1 Visit OT Evaluation $OT Eval Moderate Complexity: 1 Mod  Lou Cal, OT [employer] Pager [phone] Office [place] 10/03/2019, 5:27 PM

## 2019-10-03 NOTE — Progress Notes (Signed)
VASCULAR LAB PRELIMINARY  PRELIMINARY  PRELIMINARY  PRELIMINARY  Right upper extremity venous duplex completed.    Preliminary report:  See CV proc for preliminary results.   Verle Brillhart, RVT 10/03/2019, 1:59 PM

## 2019-10-03 NOTE — Progress Notes (Signed)
PROGRESS NOTE    Monique King  [id number] DOB: September 30, 1933 DOA: 09/30/2019 PCP: Reymundo Poll, MD   Brief Narrative:  85 year old with history of breast cancer [facility] to cough, fevers and hypoxia.  She was diagnosed with COVID-19 and transferred to [facility].  Significantly elevated D-dimer, attempted CTA chest but IV infiltrated.  Has erythema for left NT decubitus area.   Assessment & Plan:   Principal Problem:   Acute respiratory failure with hypoxia (HCC) Active Problems:   Cardiac murmur   Fever   Thrombocytopenia (HCC)   COVID-19 virus infection   Lab test positive for detection of COVID-19 virus   Bacteremia due to Staphylococcus aureus  Acute hypoxic respiratory failure secondary to COVID-19 pneumonia; improved -Oxygen levels-now on room air -Remdesivir-day 2 -Solu-Medrol IV -trend routine Covid related labs -Significantly elevated D-dimer-subcu Lovenox twice daily -Vitamin C & Zinc. Prone >16hrs/day.  -Check BNP and procalcitonin -Chest x-ray-fairly clear on admission -CTA chest 11/15-negative for PE, shows nonspecific bilateral but mostly in right middle lobe opacity, trace effusion. -Supportive care-antitussive, inhalers, I-S/flutter -CODE STATUS confirmed-DNR  Staph aureus bacteremia -Seen by Dr. Linus Salmons.  Vancomycin initiated -Repeat surveillance culture ordered 11/16  Thrombocytopenia -Secondary to underlying acute condition.  Significantly elevated D-dimer, Lovenox 40 twice daily.  Trend D-dimer..  CTA chest negative for PE  Right upper extremity erythema concerns for nonpurulent cellulitis Chronic right upper extremity lymphedema -Secondary to IV infiltration -Already on vancomycin -Upper extremity Doppler to rule out DVT  Moderate aortic valve stenosis Congestive heart failure with preserved ejection fraction, grade 2 diastolic dysfunction -Echocardiogram-ejection fraction 60 to 65% with moderate aortic valve  stenosis -Monitor volume status.  Dementia, poor historian  DVT prophylaxis: Subcu Lovenox twice daily Code Status: DNR per [name].  Family Communication: Updated her daughter Disposition Plan: Maintain hospital stay for completion of IV remdesivir.   Subjective: Overall forgetful but denies any complaints.  Review of Systems Otherwise negative except as per HPI, including: General = no fevers, chills, dizziness, malaise, fatigue HEENT/EYES = negative for pain, redness, loss of vision, double vision, blurred vision, loss of hearing, sore throat, hoarseness, dysphagia Cardiovascular= negative for chest pain, palpitation, murmurs, lower extremity swelling Respiratory/lungs= negative for shortness of breath, cough, hemoptysis, wheezing, mucus production Gastrointestinal= negative for nausea, vomiting,, abdominal pain, melena, hematemesis Genitourinary= negative for Dysuria, Hematuria, Change in Urinary Frequency MSK = Negative for arthralgia, myalgias, Back Pain, Joint swelling  Neurology= Negative for headache, seizures, numbness, tingling  Psychiatry= Negative for anxiety, depression, suicidal and homocidal ideation Allergy/Immunology= Medication/Food allergy as listed  Skin= Negative for Rash, lesions, ulcers, itching    Objective: Vitals:   10/03/19 0350 10/03/19 0530 10/03/19 0800 10/03/19 1000  BP:  (!) 107/57 122/61 (!) 120/59  Pulse:  (!) 55 67 78  Resp: 20 13 17 16   Temp:  98.3 F (36.8 C) 98.3 F (36.8 C)   TempSrc:  Oral Oral   SpO2: 91% 96% 93% 98%  Weight:      Height:        Intake/Output Summary (Last 24 hours) at 10/03/2019 1120 Last data filed at 10/03/2019 0500 Gross per 24 hour  Intake 799.55 ml  Output 1300 ml  Net -500.45 ml   Filed Weights   10/01/19 1034  Weight: 85.5 kg    Examination: Constitutional: Not in acute distress Respiratory: Minimal bibasilar rhonchi Cardiovascular: Normal sinus rhythm, no rubs Abdomen: Nontender nondistended  good bowel sounds Musculoskeletal: Chronic swelling of the right upper extremity/lymphedema Skin: Right  upper extremity erythema Neurologic: CN 2-12 grossly intact.  And nonfocal Psychiatric: Normal judgment and insight. Alert and oriented x 3. Normal mood.    Data Reviewed:   CBC: Recent Labs  Lab 09/30/19 1728 10/01/19 0435 10/03/19 0613  WBC 3.4* 4.1 2.1*  NEUTROABS 2.9  --   --   HGB 12.5 11.2* 12.2  HCT 38.9 35.5* 38.9  MCV 100.3* 100.9* 100.3*  PLT 89* 106* [id number]*   Basic Metabolic Panel: Recent Labs  Lab 09/30/19 1911 10/01/19 0435 10/03/19 0613  NA 138 139 140  K 3.8 3.9 3.4*  CL 106 104 105  CO2 24 26 25   GLUCOSE 163* 144* 109*  BUN 15 11 23   CREATININE 0.67 0.64 0.77  CALCIUM 7.9* 8.2* 8.5*  MG  --   --  1.9   GFR: Estimated Creatinine Clearance: 56.5 mL/min (by C-G formula based on SCr of 0.77 mg/dL). Liver Function Tests: Recent Labs  Lab 09/30/19 1911 10/01/19 0435 10/03/19 0613  AST 21 21 18   ALT 19 17 18   ALKPHOS 120 115 106  BILITOT 0.6 0.8 0.5  PROT 6.6 6.4* 6.7  ALBUMIN 3.7 3.6 3.5   No results for input(s): LIPASE, AMYLASE in the last 168 hours. No results for input(s): AMMONIA in the last 168 hours. Coagulation Profile: No results for input(s): INR, PROTIME in the last 168 hours. Cardiac Enzymes: Recent Labs  Lab 10/01/19 0435  CKTOTAL 62  CKMB 1.0   BNP (last 3 results) No results for input(s): PROBNP in the last 8760 hours. HbA1C: No results for input(s): HGBA1C in the last 72 hours. CBG: Recent Labs  Lab 10/02/19 0743 10/02/19 1224 10/02/19 1643 10/02/19 2250 10/03/19 0811  GLUCAP 82 258* 320* 144* 96   Lipid Profile: No results for input(s): CHOL, HDL, LDLCALC, TRIG, CHOLHDL, LDLDIRECT in the last 72 hours. Thyroid Function Tests: No results for input(s): TSH, T4TOTAL, FREET4, T3FREE, THYROIDAB in the last 72 hours. Anemia Panel: Recent Labs    10/03/19 0613  FERRITIN 1,294*   Sepsis Labs: Recent Labs  Lab  09/30/19 1728 09/30/19 1911 10/02/19 0950 10/03/19 0613  PROCALCITON  --   --  0.13 0.13  LATICACIDVEN 1.4 1.1  --   --     Recent Results (from the past 240 hour(s))  Blood culture (routine x 2)     Status: Abnormal   Collection Time: 09/30/19  5:28 PM   Specimen: BLOOD LEFT HAND  Result Value Ref Range Status   Specimen Description   Final    BLOOD LEFT HAND Performed at [facility], [place] [address], [place], [place] [address]    Special Requests   Final    BOTTLES DRAWN AEROBIC AND ANAEROBIC Blood Culture adequate volume Performed at [facility], [place] [address], [place], [place] [address]    Culture  Setup Time   Final    GRAM POSITIVE COCCI IN CLUSTERS IN BOTH AEROBIC AND ANAEROBIC BOTTLES CRITICAL RESULT CALLED TO, READ BACK BY AND VERIFIED WITH: PHARMD Palacios [id number] FCP   Performed at [facility], [place] [address], [place], [place] [address]    Culture STAPHYLOCOCCUS AUREUS (A)  Final   Report Status 10/03/2019 FINAL  Final   Organism ID, Bacteria STAPHYLOCOCCUS AUREUS  Final      Susceptibility   Staphylococcus aureus - MIC*    CIPROFLOXACIN <=0.5 SENSITIVE Sensitive     ERYTHROMYCIN <=0.25 SENSITIVE Sensitive     GENTAMICIN <=0.5  SENSITIVE Sensitive     OXACILLIN <=0.25 SENSITIVE Sensitive     TETRACYCLINE <=1 SENSITIVE Sensitive     VANCOMYCIN <=0.5 SENSITIVE Sensitive     TRIMETH/SULFA <=10 SENSITIVE Sensitive     CLINDAMYCIN <=0.25 SENSITIVE Sensitive     RIFAMPIN <=0.5 SENSITIVE Sensitive     Inducible Clindamycin NEGATIVE Sensitive     * STAPHYLOCOCCUS AUREUS  SARS CORONAVIRUS 2 (TAT 6-24 HRS) Nasopharyngeal Nasopharyngeal Swab     Status: Abnormal   Collection Time: 09/30/19  6:28 PM   Specimen: Nasopharyngeal Swab  Result Value Ref Range Status   SARS Coronavirus 2 POSITIVE (A) NEGATIVE Final    Comment: RESULT CALLED TO, READ BACK BY AND VERIFIED WITH: C.RAZILLE RN [id number] 10/01/2019  MCCORMICK K (NOTE) SARS-CoV-2 target nucleic acids are DETECTED. The SARS-CoV-2 RNA is generally detectable in upper and lower respiratory specimens during the acute phase of infection. Positive results are indicative of active infection with SARS-CoV-2. Clinical  correlation with patient history and other diagnostic information is necessary to determine patient infection status. Positive results do  not rule out bacterial infection or co-infection with other viruses. The expected result is Negative. Fact Sheet for Patients: SugarRoll.be Fact Sheet for Healthcare Providers: https://www.woods-mathews.com/ This test is not yet approved or cleared by the [place] FDA and  has been authorized for detection and/or diagnosis of SARS-CoV-2 by FDA under an Emergency Use Authorization (EUA). This EUA will remain  in effect (meaning this test can be used)  for the duration of the COVID-19 declaration under Section 564(b)(1) of the Act, 21 U.S.C. section 360bbb-3(b)(1), unless the authorization is terminated or revoked sooner. Performed at [facility], [place] [address], [place], [place] [address]   Blood culture (routine x 2)     Status: None (Preliminary result)   Collection Time: 09/30/19  7:11 PM   Specimen: BLOOD LEFT ARM  Result Value Ref Range Status   Specimen Description   Final    BLOOD LEFT ARM Performed at [facility], [place] [address], [place], [place] [address]    Special Requests   Final    BOTTLES DRAWN AEROBIC ONLY Blood Culture adequate volume Performed at [facility], [address], [place], [place] [address]    Culture   Final    NO GROWTH 3 DAYS Performed at [facility], [place] [address] [place], [place] [address]    Report Status PENDING  Incomplete  MRSA PCR Screening     Status: None   Collection Time: 10/01/19 10:32 AM   Specimen: Nasal Mucosa;  Nasopharyngeal  Result Value Ref Range Status   MRSA by PCR NEGATIVE NEGATIVE Final    Comment:        The GeneXpert MRSA Assay (FDA approved for NASAL specimens only), is one component of a comprehensive MRSA colonization surveillance program. It is not intended to diagnose MRSA infection nor to guide or monitor treatment for MRSA infections. Performed at [facility], [place] [address] [place], [place] [address]   Culture, blood (routine x 2)     Status: None (Preliminary result)   Collection Time: 10/03/19  6:13 AM   Specimen: BLOOD  Result Value Ref Range Status   Specimen Description BLOOD LEFT ANTECUBITAL  Final   Special Requests AEROBIC BOTTLE ONLY Blood Culture adequate volume  Final   Culture   Final    NO GROWTH <12 HOURS Performed at [facility], [place]  [address], [place], [place] [address]    Report Status PENDING  Incomplete  Culture, blood (routine x 2)     Status: None (Preliminary result)   Collection Time: 10/03/19  6:14 AM   Specimen: BLOOD LEFT HAND  Result Value Ref Range Status   Specimen Description   Final    BLOOD LEFT HAND Performed at [facility], [place] [address], [place], [place] [address]    Special Requests   Final    NONE Performed at [facility], [place] [address], [place], [place] [address]    Culture   Final    NO GROWTH <12 HOURS Performed at [place] [address], [place], [place] [address]    Report Status PENDING  Incomplete         Radiology Studies: Ct Angio Chest Pe W Or Wo Contrast  Result Date: 10/02/2019 CLINICAL DATA:  Chest pain EXAM: CT ANGIOGRAPHY CHEST WITH CONTRAST TECHNIQUE: Multidetector CT imaging of the chest was performed using the standard protocol during bolus administration of intravenous contrast. Multiplanar CT image reconstructions and MIPs were obtained to evaluate the vascular anatomy. CONTRAST:  154mL OMNIPAQUE IOHEXOL 350 MG/ML SOLN  COMPARISON:  None. FINDINGS: Cardiovascular: Satisfactory opacification of the pulmonary arteries to the segmental level. No evidence of pulmonary embolism. Normal heart size. Scattered coronary artery calcifications. No pericardial effusion. Aortic atherosclerosis. 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 Mediastinum/Nodes: No enlarged mediastinal, hilar, or axillary lymph nodes. Thyroid gland, trachea, and esophagus demonstrate no significant findings. Lungs/Pleura: Trace bilateral pleural effusions. There are scattered, nonspecific bilateral ground-glass opacities, most numerous in the right middle lobe (series 6, image 73). There is bandlike scarring of the lung bases. Subpleural fibrotic scarring of the anterior apex (series 6, image 20). Upper Abdomen: No acute abnormality. Musculoskeletal: Status post radical right mastectomy. Osteopenia. Chronic appearing fracture deformity of the proximal right humerus. Review of the MIP images confirms the above findings. IMPRESSION: 1.  Negative examination for pulmonary embolism. 2. There are scattered, nonspecific bilateral ground-glass opacities, most numerous in the right middle lobe (series 6, image 73), infectious or inflammatory. 3.  Trace bilateral pleural effusions. 4. Postoperative and post radiation findings of right breast malignancy. 5.  Coronary artery disease.  Aortic Atherosclerosis (ICD10-I70.0). Electronically Signed   By: Eddie Candle M.D.   On: 10/02/2019 12:48        Scheduled Meds: . docusate sodium  100 mg Oral Daily  . enoxaparin (LOVENOX) injection  40 mg Subcutaneous BID  . fentaNYL  1 patch Transdermal Q72H  . ferrous sulfate  325 mg Oral Once per day on Mon Wed Fri  . furosemide  20 mg Oral Daily  . Gerhardt's butt cream   Topical BID  . insulin aspart  0-5 Units Subcutaneous QHS  . insulin aspart  0-9 Units Subcutaneous TID WC  . Ipratropium-Albuterol  1 puff Inhalation Q6H  .  methylPREDNISolone (SOLU-MEDROL) injection  40 mg Intravenous Daily  . simvastatin  10 mg Oral q1800  . traZODone  50 mg Oral QHS   Continuous Infusions: . remdesivir 100 mg in NS 250 mL Stopped (10/03/19 1028)  . vancomycin       LOS: 2 days   Time spent= 35 mins    Ankit Arsenio Loader, MD [facility]  If 7PM-7AM, please contact night-coverage  10/03/2019, 11:20 AM

## 2019-10-03 NOTE — Evaluation (Signed)
Physical Therapy Evaluation Patient Details Name: Monique King MRN: [id number] DOB: 1933-10-15 Today's Date: 10/03/2019   History of Present Illness  Pt adm with acute respiratory failure due to covid PNA. 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 Spoke with pt's daughter and we agreed that if [facility] can provide the incr assistance that pt is currently requiring that it would be better for pt to be in a familiar environment with familiar staff due to her dementia. If [facility] is unable to provide the incr assist then pt would need to go to SNF.     Follow Up Recommendations Home health PT;Supervision/Assistance - 24 hour(at ALF if facility can provide needed assist. If not SNF)    Equipment Recommendations  None recommended by PT    Recommendations for Other Services       Precautions / Restrictions Precautions Precautions: Fall Restrictions Weight Bearing Restrictions: No      Mobility  Bed Mobility Overal bed mobility: Needs Assistance Bed Mobility: Supine to Sit     Supine to sit: +2 for physical assistance;Max assist     General bed mobility comments: Assist to bring legs off of bed, elevate trunk into sitting, and bring hips to EOB.  Transfers Overall transfer level: Needs assistance Equipment used: 2 person hand held assist;Ambulation equipment used Transfers: Sit to/from Stand Sit to Stand: +2 physical assistance;Max assist         General transfer comment: Assist to bring hips up and for balance. Used Stedy for bed to recliner.   Ambulation/Gait             General Gait Details: Unable  Stairs            Wheelchair Mobility    Modified Rankin (Stroke Patients Only)       Balance Overall balance assessment: Needs  assistance Sitting-balance support: Single extremity supported;Feet supported Sitting balance-Leahy Scale: Poor Sitting balance - Comments: UE support and min to min guard assist                                     Pertinent Vitals/Pain Pain Assessment: No/denies pain    Home Living Family/patient expects to be discharged to:: Assisted living               Home Equipment: Cane - quad;Wheelchair - manual      Prior Function Level of Independence: Needs assistance   Gait / Transfers Assistance Needed: Per daughter pt amb short distances in room with quad cane. Used w/c primarily.   ADL's / Homemaking Assistance Needed: Modified independent for toileting. Assist for bathing/dressing        Hand Dominance        Extremity/Trunk Assessment   Upper Extremity Assessment Upper Extremity Assessment: Defer to OT evaluation    Lower Extremity Assessment Lower Extremity Assessment: RLE deficits/detail;Generalized weakness RLE Deficits / Details: Decr knee flexion ROM       Communication   Communication: No difficulties  Cognition Arousal/Alertness: Awake/alert Behavior During Therapy: WFL for tasks assessed/performed Overall Cognitive Status: No family/caregiver present to determine baseline cognitive functioning  General Comments: Pt unable to relay information for prior level of function. Pt knew she was in hospital but unaware why. Followed 1 step commands. Poor memory.      General Comments General comments (skin integrity, edema, etc.): VSS    Exercises     Assessment/Plan    PT Assessment Patient needs continued PT services  PT Problem List Decreased strength;Decreased balance;Decreased mobility;Decreased cognition       PT Treatment Interventions DME instruction;Functional mobility training;Therapeutic activities;Therapeutic exercise;Balance training;Patient/family education    PT Goals  (Current goals can be found in the Care Plan section)  Acute Rehab PT Goals Patient Stated Goal: Daughter would like for pt to return to ALF if possible PT Goal Formulation: With patient/family Time For Goal Achievement: 10/17/19 Potential to Achieve Goals: Fair    Frequency Min 2X/week   Barriers to discharge        Co-evaluation PT/OT/SLP Co-Evaluation/Treatment: Yes Reason for Co-Treatment: For patient/therapist safety PT goals addressed during session: Mobility/safety with mobility;Balance         AM-PAC PT "6 Clicks" Mobility  Outcome Measure Help needed turning from your back to your side while in a flat bed without using bedrails?: Total Help needed moving from lying on your back to sitting on the side of a flat bed without using bedrails?: Total Help needed moving to and from a bed to a chair (including a wheelchair)?: Total Help needed standing up from a chair using your arms (e.g., wheelchair or bedside chair)?: Total Help needed to walk in hospital room?: Total Help needed climbing 3-5 steps with a railing? : Total 6 Click Score: 6    End of Session Equipment Utilized During Treatment: Gait belt Activity Tolerance: Patient tolerated treatment well Patient left: in chair;with call bell/phone within reach;with chair alarm set Nurse Communication: Mobility status;Need for lift equipment PT Visit Diagnosis: Other abnormalities of gait and mobility (R26.89);Muscle weakness (generalized) (M62.81)    Time: [id number] PT Time Calculation (min) (ACUTE ONLY): 21 min   Charges:   PT Evaluation $PT Eval Moderate Complexity: [place] Pager [phone] Office [place] 10/03/2019, 3:09 PM

## 2019-10-03 NOTE — Progress Notes (Signed)
Inpatient Diabetes Program Recommendations  AACE/ADA: New Consensus Statement on Inpatient Glycemic Control (2015)  Target Ranges:  Prepandial:   less than 140 mg/dL      Peak postprandial:   less than 180 mg/dL (1-2 hours)      Critically ill patients:  140 - 180 mg/dL   Lab Results  Component Value Date   GLUCAP 96 10/03/2019    Review of Glycemic Control Results for Monique King, Monique King (MRN [id number]) as of 10/03/2019 14:14  Ref. Range 10/02/2019 07:43 10/02/2019 12:24 10/02/2019 16:43 10/02/2019 22:50 10/03/2019 08:11  Glucose-Capillary Latest Ref Range: 70 - 99 mg/dL 82 258 (H) 320 (H) 144 (H) 96   Diabetes history: DM 2 Outpatient Diabetes medications: Glipizide 2.5 mg Daily Current orders for Inpatient glycemic control:  Novolog 0-9 units tid + hs  Solumedrol 40 mg Daily  Inpatient Diabetes Program Recommendations:    Glucose trends increase after steroid dose and meal intake. Consider Novolog 2-3 units tid meal coverage if pt eats >50% of meals.  Thanks,  Tama Headings RN, MSN, BC-ADM Inpatient Diabetes Coordinator Team Pager [phone] (8a-5p)

## 2019-10-04 LAB — COMPREHENSIVE METABOLIC PANEL
ALT: 19 U/L (ref 0–44)
AST: 19 U/L (ref 15–41)
Albumin: 3.3 g/dL — ABNORMAL LOW (ref 3.5–5.0)
Alkaline Phosphatase: 96 U/L (ref 38–126)
Anion gap: 9 (ref 5–15)
BUN: 20 mg/dL (ref 8–23)
CO2: 27 mmol/L (ref 22–32)
Calcium: 8.5 mg/dL — ABNORMAL LOW (ref 8.9–10.3)
Chloride: 104 mmol/L (ref 98–111)
Creatinine, Ser: 0.63 mg/dL (ref 0.44–1.00)
GFR calc Af Amer: >60 mL/min (ref >60)
GFR calc non Af Amer: >60 mL/min (ref >60)
Glucose, Bld: 96 mg/dL (ref 70–99)
Potassium: 3.7 mmol/L (ref 3.5–5.1)
Sodium: 140 mmol/L (ref 135–145)
Total Bilirubin: 0.5 mg/dL (ref 0.3–1.2)
Total Protein: 6.3 g/dL — ABNORMAL LOW (ref 6.5–8.1)

## 2019-10-04 LAB — CBC
HCT: 36.6 % (ref 36.0–46.0)
Hemoglobin: 11.9 g/dL — ABNORMAL LOW (ref 12.0–15.0)
MCH: 31.4 pg (ref 26.0–34.0)
MCHC: 32.5 g/dL (ref 30.0–36.0)
MCV: 96.6 fL (ref 80.0–100.0)
Platelets: 135 10*3/uL — ABNORMAL LOW (ref 150–400)
RBC: 3.79 MIL/uL — ABNORMAL LOW (ref 3.87–5.11)
RDW: 12.8 % (ref 11.5–15.5)
WBC: 2 10*3/uL — ABNORMAL LOW (ref 4.0–10.5)
nRBC: 0 % (ref 0.0–0.2)

## 2019-10-04 LAB — GLUCOSE, CAPILLARY
Glucose-Capillary: 109 mg/dL — ABNORMAL HIGH (ref 70–99)
Glucose-Capillary: 96 mg/dL (ref 70–99)
Glucose-Capillary: 198 mg/dL — ABNORMAL HIGH (ref 70–99)
Glucose-Capillary: 270 mg/dL — ABNORMAL HIGH (ref 70–99)
Glucose-Capillary: 197 mg/dL — ABNORMAL HIGH (ref 70–99)

## 2019-10-04 LAB — LACTATE DEHYDROGENASE: LDH: 178 U/L (ref 98–192)

## 2019-10-04 LAB — BRAIN NATRIURETIC PEPTIDE: B Natriuretic Peptide: 75.8 pg/mL (ref 0.0–100.0)

## 2019-10-04 LAB — FERRITIN: Ferritin: 1193 ng/mL — ABNORMAL HIGH (ref 11–307)

## 2019-10-04 LAB — C-REACTIVE PROTEIN: CRP: 4.3 mg/dL — ABNORMAL HIGH (ref <1.0)

## 2019-10-04 LAB — MAGNESIUM: Magnesium: 1.9 mg/dL (ref 1.7–2.4)

## 2019-10-04 LAB — INTERLEUKIN-6, SERUM  IL6SL: Interleukin-6, Serum: 10 pg/mL (ref 0.0–13.0)

## 2019-10-04 LAB — PROCALCITONIN: Procalcitonin: <0.1 ng/mL

## 2019-10-04 LAB — D-DIMER, QUANTITATIVE: D-Dimer, Quant: 0.63 ug/mL-FEU — ABNORMAL HIGH (ref 0.00–0.50)

## 2019-10-04 MED ORDER — ENOXAPARIN SODIUM 40 MG/0.4ML ~~LOC~~ SOLN
40.0000 mg | SUBCUTANEOUS | Status: DC
  Administered 2019-10-05 – 2019-10-06 (×2): 40 mg via SUBCUTANEOUS
  Filled 2019-10-04 (×2): qty 0.4

## 2019-10-04 MED ORDER — CEFAZOLIN SODIUM-DEXTROSE 2-4 GM/100ML-% IV SOLN
2.0000 g | Freq: Three times a day (TID) | INTRAVENOUS | Status: DC
  Administered 2019-10-04 – 2019-10-06 (×7): 2 g via INTRAVENOUS
  Filled 2019-10-04 (×8): qty 100

## 2019-10-04 NOTE — TOC Initial Note (Signed)
Transition of Care [facility]) - Initial/Assessment Note    Monique King Details  Name: Monique King MRN: [id number] Date of Birth: May 18, 1933  Transition of [facility]) CM/SW Contact:    Ninfa Meeker, RN Phone Number: 10/04/2019, 4:11 PM  Clinical Narrative:   Monique King is from [facility]. Has dementia, alert to self and situation. Case manager spoke with Monique King's Monique King, Monique King, [phone]. She wants her mom to recover so she can return to [facility] as soon as possible. Case Manager explained options for SNF's and Monique King asked that Monique King be the first facility faxed to, that is her first choice, she would also like for her mom to have an outside room with a window so that she can at least come by the window. Case manager agreed to make her request. FL2 has been completed, waiting for PASSR number. Will send referral out to [facility], [place] as discussed.                   Expected Discharge Plan: Skilled Nursing Facility Barriers to Discharge: [place] Monique King)   Monique King Goals and CMS Choice Monique King states their goals for this hospitalization and ongoing recovery are:: Monique King wants her mom to recover and return to her ALF CMS Medicare.gov Compare Post Acute Care list provided to:: Monique King Represenative (must comment)(Monique King)    Expected Discharge Plan and Services Expected Discharge Plan: Monique King   Discharge Planning Services: CM Consult Post Acute Care Choice: [place] Living arrangements for the past 2 months: [place]                                      Prior Living Arrangements/Services Living arrangements for the past 2 months: [place] Lives with:: Facility Resident Monique King language and need for interpreter reviewed:: Yes        Need for Family Participation in Monique King Care: Yes (Comment) Care giver support system in place?: Yes (comment)    Criminal Activity/Legal Involvement Pertinent to Current Situation/Hospitalization: No - Comment as needed  Activities of Daily Living Home Assistive Devices/Equipment: Cane (specify quad or straight) ADL Screening (condition at time of admission) Monique King's cognitive ability adequate to safely complete daily activities?: No Is the Monique King deaf or have difficulty hearing?: No Does the Monique King have difficulty seeing, even when wearing glasses/contacts?: No Does the Monique King have difficulty concentrating, remembering, or making decisions?: Yes Monique King able to express need for assistance with ADLs?: Yes Does the Monique King have difficulty dressing or bathing?: Yes Independently performs ADLs?: No Communication: Independent Dressing (OT): Needs assistance Is this a change from baseline?: Pre-admission baseline Grooming: Independent Feeding: Independent Bathing: Needs assistance Is this a change from baseline?: Pre-admission baseline Toileting: Needs assistance Is this a change from baseline?: Pre-admission baseline In/Out Bed: Needs assistance Is this a change from baseline?: Pre-admission baseline Walks in Home: Independent with device (comment) Does the Monique King have difficulty walking or climbing stairs?: Yes Weakness of Legs: Both Weakness of Arms/Hands: None  Permission Sought/Granted Permission sought to share information with : Case Manager, Chartered certified accountant granted to share information with : Yes, Verbal Permission Granted     Permission granted to share info w AGENCY: [employer], [place], [facility]        Emotional Assessment       Orientation: : Oriented to Self, Oriented to Situation Alcohol /  Substance Use: Not Applicable Psych Involvement: No (comment)  Admission diagnosis:  Hypoxia [R09.02] Acute respiratory failure with hypoxia (HCC) [J96.01] Lab test positive for detection of COVID-19 virus [U07.1] Monique King Active Problem  List   Diagnosis Date Noted  . Cellulitis of right upper extremity 10/03/2019  . Bacteremia due to Staphylococcus aureus 10/02/2019  . Fever 10/01/2019  . Thrombocytopenia ([place]) 10/01/2019  . COVID-19 virus infection 10/01/2019  . Lab test positive for detection of COVID-19 virus 10/01/2019  . Acute respiratory failure with hypoxia ([place]) 09/30/2019  . Cardiac murmur 09/30/2019   PCP:  Reymundo Poll, MD Pharmacy:  No Pharmacies Listed    Social Determinants of Health (SDOH) Interventions    Readmission Risk Interventions No flowsheet data found.

## 2019-10-04 NOTE — Progress Notes (Signed)
Inpatient Diabetes Program Recommendations  AACE/ADA: New Consensus Statement on Inpatient Glycemic Control (2015)  Target Ranges:  Prepandial:   less than 140 mg/dL      Peak postprandial:   less than 180 mg/dL (1-2 hours)      Critically ill patients:  140 - 180 mg/dL   Results for Monique King, Monique King (MRN [id number]) as of 10/04/2019 14:35  Ref. Range 10/03/2019 08:11 10/03/2019 11:32 10/03/2019 16:21 10/03/2019 20:25  Glucose-Capillary Latest Ref Range: 70 - 99 mg/dL 96 192 (H)  2 units NOVOLOG  362 (H)  9 units NOVOLOG  279 (H)  3 units NOVOLOG    Results for Monique King, Monique King (MRN [id number]) as of 10/04/2019 14:35  Ref. Range 10/04/2019 07:59 10/04/2019 12:19  Glucose-Capillary Latest Ref Range: 70 - 99 mg/dL 96 198 (H)  2 units NOVOLOG     Home DM Meds: Glipizide 2.5 mg Daily   Current Orders: Novolog Sensitive Correction Scale/ SSI (0-9 units) TID AC + HS     MD- Note patient getting Solumedrol 40 mg Daily.  Having issues with elevated afternoon CBGs.  AM CBGs have been well controlled.  Please consider the following:  Start Novolog 4 units TID with meals  (Please add the following Hold Parameters: Hold if pt eats <50% of meal, Hold if pt NPO)     --Will follow patient during hospitalization--  Wyn Quaker RN, MSN, CDE Diabetes Coordinator Inpatient Glycemic Control Team Team Pager: [phone] (8a-5p)

## 2019-10-04 NOTE — Progress Notes (Addendum)
PROGRESS NOTE    Monique King  [id number] DOB: Jun 04, 1933 DOA: 09/30/2019 PCP: Reymundo Poll, MD   Brief Narrative:  85 year old with history of breast cancer presented from [facility] secondary to cough, fevers and hypoxia.  She was diagnosed with COVID-19 and transferred to [facility].  Significantly elevated D-dimer, attempted CTA chest but IV infiltrated.  Eventually CTA was negative for PE.  On remdesivir and Solu-Medrol.  Has erythema for left forearm erythema likely cellulitis, MSSA bacteremia on for which ID is following peripherally.  Right upper extremity ultrasound-negative for DVT.   Assessment & Plan:   Principal Problem:   Acute respiratory failure with hypoxia (HCC) Active Problems:   Cardiac murmur   Fever   Thrombocytopenia (HCC)   COVID-19 virus infection   Lab test positive for detection of COVID-19 virus   Bacteremia due to Staphylococcus aureus   Cellulitis of right upper extremity  Acute hypoxic respiratory failure secondary to COVID-19 pneumonia; improved -Oxygen levels-remains on room air -Remdesivir-day 3 -Solu-Medrol IV -trend routine Covid related labs -Initially significantly elevated D-dimer therefore Lovenox twice daily. -Vitamin C & Zinc. Prone >16hrs/day.  -Procalcitonin-negative.  BNP 75 -Chest x-ray-fairly clear on admission -CTA chest 11/15-negative for PE, shows nonspecific bilateral but mostly in right middle lobe opacity, trace effusion. -Supportive care-antitussive, inhalers, I-S/flutter -CODE STATUS confirmed-DNR  Staph aureus bacteremia -Infectious disease following peripherally -Repeat cultures 11/16-remains negative  Thrombocytopenia -Secondary to underlying acute condition.  Significantly elevated D-dimer, Lovenox 40 twice daily.  Trend D-dimer..  CTA chest negative for PE. D Dimer improved- will reduce lovenox to daily.  Right upper extremity erythema concerns for nonpurulent cellulitis Chronic right upper extremity  lymphedema -Secondary to IV infiltration -Upper extremity Doppler is negative for DVT  Moderate aortic valve stenosis Congestive heart failure with preserved ejection fraction, grade 2 diastolic dysfunction -Echocardiogram-ejection fraction 60 to 65% with moderate aortic valve stenosis -Monitor volume status.  Dementia, poor historian  DVT prophylaxis: Subcu Lovenox Code Status: DNR.  [name] Family Communication: Spoke with [name] Disposition Plan: Maintain hospital stay to complete IV remdesivir course   Subjective: Feels ok, no complaints.   Review of Systems Otherwise negative except as per HPI, including:  General = no fevers, chills, dizziness, malaise, fatigue HEENT/EYES = negative for pain, redness, loss of vision, double vision, blurred vision, loss of hearing, sore throat, hoarseness, dysphagia Cardiovascular= negative for chest pain, palpitation, murmurs, lower extremity swelling Respiratory/lungs= negative for shortness of breath, cough, hemoptysis, wheezing, mucus production Gastrointestinal= negative for nausea, vomiting,, abdominal pain, melena, hematemesis Genitourinary= negative for Dysuria, Hematuria, Change in Urinary Frequency MSK = Negative for arthralgia, myalgias, Back Pain, Joint swelling  Neurology= Negative for headache, seizures, numbness, tingling  Psychiatry= Negative for anxiety, depression, suicidal and homocidal ideation Allergy/Immunology= Medication/Food allergy as listed  Skin= Negative for Rash, lesions, ulcers, itching   Objective: Vitals:   10/03/19 1626 10/03/19 1946 10/03/19 1947 10/04/19 0510  BP: (!) 147/75  140/69 131/68  Pulse: (!) 59  67 61  Resp: 20  18 (!) 22  Temp: 98.5 F (36.9 C) 98.1 F (36.7 C)  98.4 F (36.9 C)  TempSrc: Oral Oral  Oral  SpO2: 95%  95% 95%  Weight:    80 kg  Height:        Intake/Output Summary (Last 24 hours) at 10/04/2019 0733 Last data filed at 10/04/2019 0510 Gross per 24 hour  Intake 679.32 ml   Output 2500 ml  Net -1820.68 ml   Filed Weights   10/01/19 1034  10/04/19 0510  Weight: 85.5 kg 80 kg    Examination: Constitutional: Not in acute distress Respiratory: b/l rhonci at the bases.  Cardiovascular: Normal sinus rhythm, no rubs Abdomen: Nontender nondistended good bowel sounds Musculoskeletal: RUE lymphedema chronic.  Skin: RUE erythema- improved.  Neurologic: CN 2-12 grossly intact.  And nonfocal Psychiatric: Normal judgment and insight. Alert and oriented x 3. Normal mood.   Data Reviewed:   CBC: Recent Labs  Lab 09/30/19 1728 10/01/19 0435 10/03/19 0613  WBC 3.4* 4.1 2.1*  NEUTROABS 2.9  --   --   HGB 12.5 11.2* 12.2  HCT 38.9 35.5* 38.9  MCV 100.3* 100.9* 100.3*  PLT 89* 106* [id number]*   Basic Metabolic Panel: Recent Labs  Lab 09/30/19 1911 10/01/19 0435 10/03/19 0613  NA 138 139 140  K 3.8 3.9 3.4*  CL 106 104 105  CO2 24 26 25   GLUCOSE 163* 144* 109*  BUN 15 11 23   CREATININE 0.67 0.64 0.77  CALCIUM 7.9* 8.2* 8.5*  MG  --   --  1.9   GFR: Estimated Creatinine Clearance: 54.7 mL/min (by C-G formula based on SCr of 0.77 mg/dL). Liver Function Tests: Recent Labs  Lab 09/30/19 1911 10/01/19 0435 10/03/19 0613  AST 21 21 18   ALT 19 17 18   ALKPHOS 120 115 106  BILITOT 0.6 0.8 0.5  PROT 6.6 6.4* 6.7  ALBUMIN 3.7 3.6 3.5   No results for input(s): LIPASE, AMYLASE in the last 168 hours. No results for input(s): AMMONIA in the last 168 hours. Coagulation Profile: No results for input(s): INR, PROTIME in the last 168 hours. Cardiac Enzymes: Recent Labs  Lab 10/01/19 0435  CKTOTAL 62  CKMB 1.0   BNP (last 3 results) No results for input(s): PROBNP in the last 8760 hours. HbA1C: No results for input(s): HGBA1C in the last 72 hours. CBG: Recent Labs  Lab 10/02/19 2250 10/03/19 0811 10/03/19 1132 10/03/19 1621 10/03/19 2025  GLUCAP 144* 96 192* 362* 279*   Lipid Profile: No results for input(s): CHOL, HDL, LDLCALC, TRIG,  CHOLHDL, LDLDIRECT in the last 72 hours. Thyroid Function Tests: No results for input(s): TSH, T4TOTAL, FREET4, T3FREE, THYROIDAB in the last 72 hours. Anemia Panel: Recent Labs    10/03/19 0613  FERRITIN 1,294*   Sepsis Labs: Recent Labs  Lab 09/30/19 1728 09/30/19 1911 10/02/19 0950 10/03/19 0613  PROCALCITON  --   --  0.13 0.13  LATICACIDVEN 1.4 1.1  --   --     Recent Results (from the past 240 hour(s))  Blood culture (routine x 2)     Status: Abnormal   Collection Time: 09/30/19  5:28 PM   Specimen: BLOOD LEFT HAND  Result Value Ref Range Status   Specimen Description   Final    BLOOD LEFT HAND Performed at [facility], [place] [address] [place], [place] [address]    Special Requests   Final    BOTTLES DRAWN AEROBIC AND ANAEROBIC Blood Culture adequate volume Performed at [facility], [place] [address], [place], [place] [address]    Culture  Setup Time   Final    GRAM POSITIVE COCCI IN CLUSTERS IN BOTH AEROBIC AND ANAEROBIC BOTTLES CRITICAL RESULT CALLED TO, READ BACK BY AND VERIFIED WITH: PHARMD El Tumbao [id number] FCP   Performed at [facility], [place] [address], [place], [place] [address]    Culture STAPHYLOCOCCUS AUREUS (A)  Final   Report Status 10/03/2019 FINAL  Final  Organism ID, Bacteria STAPHYLOCOCCUS AUREUS  Final      Susceptibility   Staphylococcus aureus - MIC*    CIPROFLOXACIN <=0.5 SENSITIVE Sensitive     ERYTHROMYCIN <=0.25 SENSITIVE Sensitive     GENTAMICIN <=0.5 SENSITIVE Sensitive     OXACILLIN <=0.25 SENSITIVE Sensitive     TETRACYCLINE <=1 SENSITIVE Sensitive     VANCOMYCIN <=0.5 SENSITIVE Sensitive     TRIMETH/SULFA <=10 SENSITIVE Sensitive     CLINDAMYCIN <=0.25 SENSITIVE Sensitive     RIFAMPIN <=0.5 SENSITIVE Sensitive     Inducible Clindamycin NEGATIVE Sensitive     * STAPHYLOCOCCUS AUREUS  SARS CORONAVIRUS 2 (TAT 6-24 HRS) Nasopharyngeal Nasopharyngeal Swab     Status:  Abnormal   Collection Time: 09/30/19  6:28 PM   Specimen: Nasopharyngeal Swab  Result Value Ref Range Status   SARS Coronavirus 2 POSITIVE (A) NEGATIVE Final    Comment: RESULT CALLED TO, READ BACK BY AND VERIFIED WITH: C.RAZILLE RN [id number] 10/01/2019 MCCORMICK K (NOTE) SARS-CoV-2 target nucleic acids are DETECTED. The SARS-CoV-2 RNA is generally detectable in upper and lower respiratory specimens during the acute phase of infection. Positive results are indicative of active infection with SARS-CoV-2. Clinical  correlation with patient history and other diagnostic information is necessary to determine patient infection status. Positive results do  not rule out bacterial infection or co-infection with other viruses. The expected result is Negative. Fact Sheet for Patients: SugarRoll.be Fact Sheet for Healthcare Providers: https://www.woods-mathews.com/ This test is not yet approved or cleared by the [place] FDA and  has been authorized for detection and/or diagnosis of SARS-CoV-2 by FDA under an Emergency Use Authorization (EUA). This EUA will remain  in effect (meaning this test can be used)  for the duration of the COVID-19 declaration under Section 564(b)(1) of the Act, 21 U.S.C. section 360bbb-3(b)(1), unless the authorization is terminated or revoked sooner. Performed at [facility], [place] [address] [place], [place] [address]   Blood culture (routine x 2)     Status: None (Preliminary result)   Collection Time: 09/30/19  7:11 PM   Specimen: BLOOD LEFT ARM  Result Value Ref Range Status   Specimen Description   Final    BLOOD LEFT ARM Performed at [facility], [place] [address], [place], [place] [address]    Special Requests   Final    BOTTLES DRAWN AEROBIC ONLY Blood Culture adequate volume Performed at [facility], [address], [place], [place] [address]    Culture    Final    NO GROWTH 3 DAYS Performed at [facility], [place] [address], [place], [place] [address]    Report Status PENDING  Incomplete  MRSA PCR Screening     Status: None   Collection Time: 10/01/19 10:32 AM   Specimen: Nasal Mucosa; Nasopharyngeal  Result Value Ref Range Status   MRSA by PCR NEGATIVE NEGATIVE Final    Comment:        The GeneXpert MRSA Assay (FDA approved for NASAL specimens only), is one component of a comprehensive MRSA colonization surveillance program. It is not intended to diagnose MRSA infection nor to guide or monitor treatment for MRSA infections. Performed at [facility], [place] [address] [place], [place] [address]   Culture, blood (routine x 2)     Status: None (Preliminary result)   Collection Time: 10/03/19  6:13 AM   Specimen: BLOOD  Result Value Ref Range Status   Specimen Description  BLOOD LEFT ANTECUBITAL  Final   Special Requests AEROBIC BOTTLE ONLY Blood Culture adequate volume  Final   Culture   Final    NO GROWTH <12 HOURS Performed at [facility], [address] [place], [place] [address]    Report Status PENDING  Incomplete  Culture, blood (routine x 2)     Status: None (Preliminary result)   Collection Time: 10/03/19  6:14 AM   Specimen: BLOOD LEFT HAND  Result Value Ref Range Status   Specimen Description   Final    BLOOD LEFT HAND Performed at [facility], [place] [address], [place], [place] [address]    Special Requests   Final    NONE Performed at [facility], [place] [address] [place], [place] [address]    Culture   Final    NO GROWTH <12 HOURS Performed at [place] [address] [place], [place] [address]    Report Status PENDING  Incomplete         Radiology Studies: Ct Angio Chest Pe W Or Wo Contrast  Result Date: 10/02/2019 CLINICAL DATA:  Chest pain EXAM: CT ANGIOGRAPHY CHEST WITH CONTRAST TECHNIQUE: Multidetector CT imaging of the chest was  performed using the standard protocol during bolus administration of intravenous contrast. Multiplanar CT image reconstructions and MIPs were obtained to evaluate the vascular anatomy. CONTRAST:  175mL OMNIPAQUE IOHEXOL 350 MG/ML SOLN COMPARISON:  None. FINDINGS: Cardiovascular: Satisfactory opacification of the pulmonary arteries to the segmental level. No evidence of pulmonary embolism. Normal heart size. Scattered coronary artery calcifications. No pericardial effusion. Aortic atherosclerosis. 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 Mediastinum/Nodes: No enlarged mediastinal, hilar, or axillary lymph nodes. Thyroid gland, trachea, and esophagus demonstrate no significant findings. Lungs/Pleura: Trace bilateral pleural effusions. There are scattered, nonspecific bilateral ground-glass opacities, most numerous in the right middle lobe (series 6, image 73). There is bandlike scarring of the lung bases. Subpleural fibrotic scarring of the anterior apex (series 6, image 20). Upper Abdomen: No acute abnormality. Musculoskeletal: Status post radical right mastectomy. Osteopenia. Chronic appearing fracture deformity of the proximal right humerus. Review of the MIP images confirms the above findings. IMPRESSION: 1.  Negative examination for pulmonary embolism. 2. There are scattered, nonspecific bilateral ground-glass opacities, most numerous in the right middle lobe (series 6, image 73), infectious or inflammatory. 3.  Trace bilateral pleural effusions. 4. Postoperative and post radiation findings of right breast malignancy. 5.  Coronary artery disease.  Aortic Atherosclerosis (ICD10-I70.0). Electronically Signed   By: Eddie Candle M.D.   On: 10/02/2019 12:48   Vas [place] Upper Extremity Venous Duplex  Result Date: 10/03/2019 UPPER VENOUS STUDY  Indications: Edema, and Patient has had right arm lymphedema since 1974, status post mastectomy Limitations: Edema, patient unable to  move arm. Comparison Study: No prior study on file Performing Technologist: Sharion Dove RVS  Examination Guidelines: A complete evaluation includes B-mode imaging, spectral Doppler, color Doppler, and power Doppler as needed of all accessible portions of each vessel. Bilateral testing is considered an integral part of a complete examination. Limited examinations for reoccurring indications may be performed as noted.  Right Findings: +----------+------------+---------+-----------+----------+-------+ RIGHT     CompressiblePhasicitySpontaneousPropertiesSummary +----------+------------+---------+-----------+----------+-------+ IJV           Full       Yes       Yes                      +----------+------------+---------+-----------+----------+-------+  Subclavian    Full       Yes       Yes                      +----------+------------+---------+-----------+----------+-------+ Axillary                 Yes       Yes                      +----------+------------+---------+-----------+----------+-------+ Brachial                 Yes       Yes                      +----------+------------+---------+-----------+----------+-------+ Cephalic      Full                                          +----------+------------+---------+-----------+----------+-------+ Basilic       Full                                          +----------+------------+---------+-----------+----------+-------+  Left Findings: +----------+------------+---------+-----------+----------+-------+ LEFT      CompressiblePhasicitySpontaneousPropertiesSummary +----------+------------+---------+-----------+----------+-------+ Subclavian               Yes       Yes                      +----------+------------+---------+-----------+----------+-------+  Summary:  Right: No evidence of deep vein thrombosis in the upper extremity. However, unable to visualize the radial ulnar. No evidence of superficial  vein thrombosis in the upper extremity. However, unable to visualize the radial ulnar. No evidence of thrombosis in the . However, unable to visualize the radial ulnar. This was a limited study.  Left: No evidence of thrombosis in the subclavian.  *See table(s) above for measurements and observations.    Preliminary         Scheduled Meds: . docusate sodium  100 mg Oral Daily  . enoxaparin (LOVENOX) injection  40 mg Subcutaneous BID  . fentaNYL  1 patch Transdermal Q72H  . ferrous sulfate  325 mg Oral Once per day on Mon Wed Fri  . furosemide  20 mg Oral Daily  . Gerhardt's butt cream   Topical BID  . insulin aspart  0-5 Units Subcutaneous QHS  . insulin aspart  0-9 Units Subcutaneous TID WC  . Ipratropium-Albuterol  1 puff Inhalation Q6H  . methylPREDNISolone (SOLU-MEDROL) injection  40 mg Intravenous Daily  . simvastatin  10 mg Oral q1800  . traZODone  50 mg Oral QHS   Continuous Infusions: . remdesivir 100 mg in NS 250 mL Stopped (10/03/19 1028)     LOS: 3 days   Time spent= 25 mins    Neeva Trew Arsenio Loader, MD [facility]  If 7PM-7AM, please contact night-coverage  10/04/2019, 7:33 AM

## 2019-10-04 NOTE — Progress Notes (Signed)
Subjective:  I did not examine pt in person   Antibiotics:  Anti-infectives (From admission, onward)   Start     Dose/Rate Route Frequency Ordered Stop   10/03/19 1200  vancomycin (VANCOCIN) IVPB 1000 mg/200 mL premix  Status:  Discontinued     1,000 mg 200 mL/hr over 60 Minutes Intravenous Every 24 hours 10/02/19 1228 10/03/19 1419   10/02/19 1230  vancomycin (VANCOCIN) 1,750 mg in sodium chloride 0.9 % 500 mL IVPB     1,750 mg 250 mL/hr over 120 Minutes Intravenous  Once 10/02/19 1228 10/02/19 1622   10/02/19 1000  remdesivir 100 mg in sodium chloride 0.9 % 250 mL IVPB     100 mg 500 mL/hr over 30 Minutes Intravenous Every 24 hours 10/01/19 0600 10/06/19 0959   10/01/19 0645  remdesivir 200 mg in sodium chloride 0.9 % 250 mL IVPB     200 mg 500 mL/hr over 30 Minutes Intravenous Once 10/01/19 0600 10/01/19 1900      Medications: Scheduled Meds: . docusate sodium  100 mg Oral Daily  . enoxaparin (LOVENOX) injection  40 mg Subcutaneous BID  . fentaNYL  1 patch Transdermal Q72H  . ferrous sulfate  325 mg Oral Once per day on Mon Wed Fri  . furosemide  20 mg Oral Daily  . Gerhardt's butt cream   Topical BID  . insulin aspart  0-5 Units Subcutaneous QHS  . insulin aspart  0-9 Units Subcutaneous TID WC  . Ipratropium-Albuterol  1 puff Inhalation Q6H  . methylPREDNISolone (SOLU-MEDROL) injection  40 mg Intravenous Daily  . simvastatin  10 mg Oral q1800  . traZODone  50 mg Oral QHS   Continuous Infusions: . remdesivir 100 mg in NS 250 mL 100 mg (10/04/19 1048)   PRN Meds:.acetaminophen **OR** acetaminophen, diazepam, hydrALAZINE, polyethylene glycol, senna-docusate    Objective: Weight change:   Intake/Output Summary (Last 24 hours) at 10/04/2019 1201 Last data filed at 10/04/2019 0510 Gross per 24 hour  Intake 450 ml  Output 2500 ml  Net -2050 ml   Blood pressure (!) 143/65, pulse 63, temperature 98.6 F (37 C), temperature source Oral, resp. rate 18,  height 5\' 5"  (1.651 m), weight 80 kg, SpO2 93 %. Temp:  [98.1 F (36.7 C)-98.6 F (37 C)] 98.6 F (37 C) (11/17 0737) Pulse Rate:  [59-67] 63 (11/17 0737) Resp:  [18-22] 18 (11/17 0737) BP: (131-147)/(65-75) 143/65 (11/17 0737) SpO2:  [93 %-95 %] 93 % (11/17 0737) Weight:  [80 kg] 80 kg (11/17 0510)  Physical Exam:  Vitals:   10/04/19 0510 10/04/19 0737  BP: 131/68 (!) 143/65  Pulse: 61 63  Resp: (!) 22 18  Temp: 98.4 F (36.9 C) 98.6 F (37 C)  SpO2: 95% 93%     CBC:    BMET Recent Labs    10/03/19 0613 10/04/19 0430  NA 140 140  K 3.4* 3.7  CL 105 104  CO2 25 27  GLUCOSE 109* 96  BUN 23 20  CREATININE 0.77 0.63  CALCIUM 8.5* 8.5*     Liver Panel  Recent Labs    10/03/19 0613 10/04/19 0430  PROT 6.7 6.3*  ALBUMIN 3.5 3.3*  AST 18 19  ALT 18 19  ALKPHOS 106 96  BILITOT 0.5 0.5       Sedimentation Rate No results for input(s): ESRSEDRATE in the last 72 hours. C-Reactive Protein Recent Labs    10/03/19 0613 10/04/19 0430  CRP 7.5* 4.3*  Micro Results: Recent Results (from the past 720 hour(s))  Blood culture (routine x 2)     Status: Abnormal   Collection Time: 09/30/19  5:28 PM   Specimen: BLOOD LEFT HAND  Result Value Ref Range Status   Specimen Description   Final    BLOOD LEFT HAND Performed at [facility], [address], [place], [place] [address]    Special Requests   Final    BOTTLES DRAWN AEROBIC AND ANAEROBIC Blood Culture adequate volume Performed at [facility], [place] [address], [place], [place] [address]    Culture  Setup Time   Final    GRAM POSITIVE COCCI IN CLUSTERS IN BOTH AEROBIC AND ANAEROBIC BOTTLES CRITICAL RESULT CALLED TO, READ BACK BY AND VERIFIED WITH: PHARMD Pol[place] [id number] FCP   Performed at [facility], [place] [address], [place], [place] [address]    Culture STAPHYLOCOCCUS AUREUS (A)  Final   Report Status 10/03/2019 FINAL  Final    Organism ID, Bacteria STAPHYLOCOCCUS AUREUS  Final      Susceptibility   Staphylococcus aureus - MIC*    CIPROFLOXACIN <=0.5 SENSITIVE Sensitive     ERYTHROMYCIN <=0.25 SENSITIVE Sensitive     GENTAMICIN <=0.5 SENSITIVE Sensitive     OXACILLIN <=0.25 SENSITIVE Sensitive     TETRACYCLINE <=1 SENSITIVE Sensitive     VANCOMYCIN <=0.5 SENSITIVE Sensitive     TRIMETH/SULFA <=10 SENSITIVE Sensitive     CLINDAMYCIN <=0.25 SENSITIVE Sensitive     RIFAMPIN <=0.5 SENSITIVE Sensitive     Inducible Clindamycin NEGATIVE Sensitive     * STAPHYLOCOCCUS AUREUS  SARS CORONAVIRUS 2 (TAT 6-24 HRS) Nasopharyngeal Nasopharyngeal Swab     Status: Abnormal   Collection Time: 09/30/19  6:28 PM   Specimen: Nasopharyngeal Swab  Result Value Ref Range Status   SARS Coronavirus 2 POSITIVE (A) NEGATIVE Final    Comment: RESULT CALLED TO, READ BACK BY AND VERIFIED WITH: C.RAZILLE RN [id number] 10/01/2019 MCCORMICK K (NOTE) SARS-CoV-2 target nucleic acids are DETECTED. The SARS-CoV-2 RNA is generally detectable in upper and lower respiratory specimens during the acute phase of infection. Positive results are indicative of active infection with SARS-CoV-2. Clinical  correlation with patient history and other diagnostic information is necessary to determine patient infection status. Positive results do  not rule out bacterial infection or co-infection with other viruses. The expected result is Negative. Fact Sheet for Patients: SugarRoll.be Fact Sheet for Healthcare Providers: https://www.woods-mathews.com/ This test is not yet approved or cleared by the [place] FDA and  has been authorized for detection and/or diagnosis of SARS-CoV-2 by FDA under an Emergency Use Authorization (EUA). This EUA will remain  in effect (meaning this test can be used)  for the duration of the COVID-19 declaration under Section 564(b)(1) of the Act, 21 U.S.C. section 360bbb-3(b)(1), unless  the authorization is terminated or revoked sooner. Performed at [facility], [place] [address] [place], [place] [address]   Blood culture (routine x 2)     Status: None (Preliminary result)   Collection Time: 09/30/19  7:11 PM   Specimen: BLOOD LEFT ARM  Result Value Ref Range Status   Specimen Description   Final    BLOOD LEFT ARM Performed at [facility], [place] [address] [place], [place] [address]    Special Requests   Final    BOTTLES DRAWN AEROBIC ONLY Blood Culture adequate volume Performed at [facility], [address] [place], [place] [address]    Culture   Final    NO GROWTH 4 DAYS Performed at [facility], [place] [address] [place], [place] [address]    Report Status PENDING  Incomplete  MRSA PCR Screening     Status: None   Collection Time: 10/01/19 10:32 AM   Specimen: Nasal Mucosa; Nasopharyngeal  Result Value Ref Range Status   MRSA by PCR NEGATIVE NEGATIVE Final    Comment:        The GeneXpert MRSA Assay (FDA approved for NASAL specimens only), is one component of a comprehensive MRSA colonization surveillance program. It is not intended to diagnose MRSA infection nor to guide or monitor treatment for MRSA infections. Performed at [facility], [place] [address] [place], [place] [address]   Culture, blood (routine x 2)     Status: None (Preliminary result)   Collection Time: 10/03/19  6:13 AM   Specimen: BLOOD  Result Value Ref Range Status   Specimen Description BLOOD LEFT ANTECUBITAL  Final   Special Requests AEROBIC BOTTLE ONLY Blood Culture adequate volume  Final   Culture   Final    NO GROWTH 1 DAY Performed at [facility], [place] [address] [place], [place] [address]    Report Status PENDING  Incomplete  Culture, blood (routine x 2)     Status: None (Preliminary result)   Collection Time: 10/03/19  6:14 AM   Specimen: BLOOD LEFT HAND  Result Value Ref Range  Status   Specimen Description   Final    BLOOD LEFT HAND Performed at [facility], [place] [address] [place], [place] [address]    Special Requests   Final    NONE Performed at [facility], [place] [address] [place], [place] [address]    Culture   Final    NO GROWTH 1 DAY Performed at [facility], [place] [address] [place],  [address]    Report Status PENDING  Incomplete    Studies/Results: Ct Angio Chest Pe W Or Wo Contrast  Result Date: 10/02/2019 CLINICAL DATA:  Chest pain EXAM: CT ANGIOGRAPHY CHEST WITH CONTRAST TECHNIQUE: Multidetector CT imaging of the chest was performed using the standard protocol during bolus administration of intravenous contrast. Multiplanar CT image reconstructions and MIPs were obtained to evaluate the vascular anatomy. CONTRAST:  115mL OMNIPAQUE IOHEXOL 350 MG/ML SOLN COMPARISON:  None. FINDINGS: Cardiovascular: Satisfactory opacification of the pulmonary arteries to the segmental level. No evidence of pulmonary embolism. Normal heart size. Scattered coronary artery calcifications. No pericardial effusion. Aortic atherosclerosis. 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 Mediastinum/Nodes: No enlarged mediastinal, hilar, or axillary lymph nodes. Thyroid gland, trachea, and esophagus demonstrate no significant findings. Lungs/Pleura: Trace bilateral pleural effusions. There are scattered, nonspecific bilateral ground-glass opacities, most numerous in the right middle lobe (series 6, image 73). There is bandlike scarring of the lung bases. Subpleural fibrotic scarring of the anterior apex (series 6, image 20). Upper Abdomen: No acute abnormality. Musculoskeletal: Status post radical right mastectomy. Osteopenia. Chronic appearing fracture deformity of the proximal right humerus. Review of the MIP images confirms the above findings. IMPRESSION: 1.  Negative examination for pulmonary embolism.  2. There are scattered, nonspecific bilateral ground-glass opacities, most numerous in the right middle lobe (series 6, image 73), infectious or inflammatory. 3.  Trace bilateral pleural effusions. 4. Postoperative and post radiation findings of right breast malignancy. 5.  Coronary artery  disease.  Aortic Atherosclerosis (ICD10-I70.0). Electronically Signed   By: Eddie Candle M.D.   On: 10/02/2019 12:48   Vas [place] Upper Extremity Venous Duplex  Result Date: 10/03/2019 UPPER VENOUS STUDY  Indications: Edema, and Patient has had right arm lymphedema since 1974, status post mastectomy Limitations: Edema, patient unable to move arm. Comparison Study: No prior study on file Performing Technologist: Sharion Dove RVS  Examination Guidelines: A complete evaluation includes B-mode imaging, spectral Doppler, color Doppler, and power Doppler as needed of all accessible portions of each vessel. Bilateral testing is considered an integral part of a complete examination. Limited examinations for reoccurring indications may be performed as noted.  Right Findings: +----------+------------+---------+-----------+----------+-------+ RIGHT     CompressiblePhasicitySpontaneousPropertiesSummary +----------+------------+---------+-----------+----------+-------+ IJV           Full       Yes       Yes                      +----------+------------+---------+-----------+----------+-------+ Subclavian    Full       Yes       Yes                      +----------+------------+---------+-----------+----------+-------+ Axillary                 Yes       Yes                      +----------+------------+---------+-----------+----------+-------+ Brachial                 Yes       Yes                      +----------+------------+---------+-----------+----------+-------+ Cephalic      Full                                          +----------+------------+---------+-----------+----------+-------+  Basilic       Full                                          +----------+------------+---------+-----------+----------+-------+  Left Findings: +----------+------------+---------+-----------+----------+-------+ LEFT      CompressiblePhasicitySpontaneousPropertiesSummary +----------+------------+---------+-----------+----------+-------+ Subclavian               Yes       Yes                      +----------+------------+---------+-----------+----------+-------+  Summary:  Right: No evidence of deep vein thrombosis in the upper extremity. However, unable to visualize the radial ulnar. No evidence of superficial vein thrombosis in the upper extremity. However, unable to visualize the radial ulnar. No evidence of thrombosis in the . However, unable to visualize the radial ulnar. This was a limited study.  Left: No evidence of thrombosis in the subclavian.  *See table(s) above for measurements and observations.    Preliminary       Assessment/Plan:  INTERVAL HISTORY:  Repeat blood cultures taken   Principal Problem:   Acute respiratory failure with hypoxia (HCC) Active Problems:   Cardiac murmur   Fever   Thrombocytopenia (HCC)   COVID-19 virus infection   Lab test positive for detection of COVID-19 virus  Bacteremia due to Staphylococcus aureus   Cellulitis of right upper extremity    Monique [facility] is a 85 y.o. female with  Hx of breast cancer admitted with COVID-19 pnejumonia but has also now been found to have MSSA in 1/2 blood cultures  #1 MSSA bacteremia:       [place] [facility] Staphylococcus aureus bacteremia   Staphylococcus aureus bacteremia (SAB) is associated with a high rate of complications and mortality.  Specific aspects of clinical management are critical to optimizing the outcome of patients with SAB.  Therefore, the [facility] Antimicrobial Management Team [facility]) has initiated an intervention aimed at improving the  management of SAB at [facility].  To do so, Infectious Diseases physicians are providing an evidence-based consult for the management of all patients with SAB.     Yes No Comments  Perform follow-up blood cultures (even if the patient is afebrile) to ensure clearance of bacteremia [x]  []  Repeat blood cultures taken  Remove vascular catheter and obtain follow-up blood cultures after the removal of the catheter []  []  DO NOT PLACE LINE  Perform echocardiography to evaluate for endocarditis (transthoracic ECHO is 40-50% sensitive, TEE is > 90% sensitive) []  []  Please keep in mind, that neither test can definitively EXCLUDE endocarditis, and that should clinical suspicion remain high for endocarditis the patient should then still be treated with an "endocarditis" duration of therapy = 6 weeks TTE done but TEE not going to be done at [facility] to evaluate implanted cardiac device (pacemaker, ICD) []  []    Ensure source control []  []  It sounds as if her UE where she has lymphedema and cellulitis is possible source  Investigate for "metastatic" sites of infection []  []  Does the patient have ANY symptom or physical exam finding that would suggest a deeper infection (back or neck pain that may be suggestive of vertebral osteomyelitis or epidural abscess, muscle pain that could be a symptom of pyomyositis)?  Keep in mind that for deep seeded infections MRI imaging with contrast is preferred rather than other often insensitive tests such as plain x-rays, especially early in a patient's presentation.  Change antibiotic therapy to cefazolin []  []  Beta-lactam antibiotics are preferred for MSSA due to higher cure rates.   If on Vancomycin, goal trough should be 15 - 20 mcg/mL  Estimated duration of IV antibiotic therapy:  4-6 weeks []  []  Consult case management for probably prolonged outpatient IV antibiotic therapy    If she improves and can be DC will need to consider ? Get IV abx outside  the hospital vs less orthodox approaches.    LOS: 3 days   Alcide Evener 10/04/2019, 12:01 PM

## 2019-10-04 NOTE — Plan of Care (Signed)

## 2019-10-04 NOTE — NC FL2 (Signed)
[place] LEVEL OF CARE SCREENING TOOL     IDENTIFICATION  Patient Name: Monique [facility] Birthdate: [birth year] Sex: female Admission Date (Current Location): 09/30/2019  [facility] and [place] Number:  Herbalist and Address:  [facility] [place], [place] [address], [place], [place] [address]([phone]      Provider Number: [id number]  Attending Physician Name and Address:  Damita Lack, MD  Relative Name and Phone Number:  Daughter: [name] [id number]: [name] [phone])    Current Level of Care: Hospital Recommended Level of Care: [place] Prior Approval Number:    Date Approved/Denied:   PASRR Number:    Discharge Plan: SNF    Current Diagnoses: Patient Active Problem List   Diagnosis Date Noted  . Cellulitis of right upper extremity 10/03/2019  . Bacteremia due to Staphylococcus aureus 10/02/2019  . Fever 10/01/2019  . Thrombocytopenia ([place]) 10/01/2019  . COVID-19 virus infection 10/01/2019  . Lab test positive for detection of COVID-19 virus 10/01/2019  . Acute respiratory failure with hypoxia ([place]) 09/30/2019  . Cardiac murmur 09/30/2019    Orientation RESPIRATION BLADDER Height & Weight     Place, Self  Normal Incontinent Weight: 80 kg Height:  5\' 5"  (165.1 cm)  BEHAVIORAL SYMPTOMS/MOOD NEUROLOGICAL BOWEL NUTRITION STATUS      Incontinent Diet  AMBULATORY STATUS COMMUNICATION OF NEEDS Skin   Extensive Assist(uses Wheelchair)   Normal(right arm has lymphyedema from old mastectomy, flaccid from old s)                       [place], Dressing(needs to be setup for meals) Bathing Assistance: Maximum assistance   Dressing Assistance: Maximum assistance     Functional Limitations Info             SPECIAL CARE FACTORS FREQUENCY  OT (By licensed OT), PT (By licensed PT)     PT Frequency: 5x/week OT Frequency:  min 3x/week            Contractures Contractures Info: Not present    Additional Factors Info  Code Status Code Status Info: DNR             Current Medications (10/04/2019):  This is the current hospital active medication list Current Facility-Administered Medications  Medication Dose Route Frequency Provider Last Rate Last Dose  . acetaminophen (TYLENOL) tablet 650 mg  650 mg Oral Q6H PRN Tomma Rakers, MD   650 mg at 10/02/19 2222   Or  . acetaminophen (TYLENOL) suppository 650 mg  650 mg Rectal Q6H PRN Hollice Gong, Mir Mohammed, MD      . ceFAZolin (ANCEF) IVPB 2g/100 mL premix  2 g Intravenous Q8H Shade, Christine E, RPH 200 mL/hr at 10/04/19 1506 2 g at 10/04/19 1506  . diazepam (VALIUM) tablet 1 mg  1 mg Oral QHS PRN Tomma Rakers, MD   1 mg at 10/03/19 2115  . docusate sodium (COLACE) capsule 100 mg  100 mg Oral Daily Hollice Gong, Mir Mohammed, MD   100 mg at 10/04/19 0856  . [START ON 10/05/2019] enoxaparin (LOVENOX) injection 40 mg  40 mg Subcutaneous Q24H Amin, Ankit Chirag, MD      . fentaNYL (DURAGESIC) 25 MCG/HR 1 patch  1 patch Transdermal Q72H Tomma Rakers, MD   1 patch at 10/04/19 [phone]  . ferrous sulfate tablet 325 mg  325 mg Oral Once per day on  Mon Wed Fri Hollice Gong, Mir Mohammed, MD   325 mg at 10/03/19 [id number]  . furosemide (LASIX) tablet 20 mg  20 mg Oral Daily Hollice Gong, Mir Mohammed, MD   20 mg at 10/04/19 0856  . Gerhardt's butt cream   Topical BID Amin, Ankit Chirag, MD      . hydrALAZINE (APRESOLINE) injection 10 mg  10 mg Intravenous Q4H PRN Amin, Ankit Chirag, MD      . insulin aspart (novoLOG) injection 0-5 Units  0-5 Units Subcutaneous QHS Tomma Rakers, MD   3 Units at 10/03/19 2115  . insulin aspart (novoLOG) injection 0-9 Units  0-9 Units Subcutaneous TID WC Tomma Rakers, MD   2 Units at 10/04/19 1239  . Ipratropium-Albuterol (COMBIVENT) respimat 1 puff  1 puff Inhalation Q6H Amin, Ankit Chirag,  MD   1 puff at 10/04/19 1305  . methylPREDNISolone sodium succinate (SOLU-MEDROL) 40 mg/mL injection 40 mg  40 mg Intravenous Daily Amin, Ankit Chirag, MD   40 mg at 10/04/19 0857  . polyethylene glycol (MIRALAX / GLYCOLAX) packet 17 g  17 g Oral Daily PRN Amin, Ankit Chirag, MD      . remdesivir 100 mg in sodium chloride 0.9 % 250 mL IVPB  100 mg Intravenous Q24H Tomma Rakers, MD   Stopped at 10/04/19 1118  . senna-docusate (Senokot-S) tablet 2 tablet  2 tablet Oral QHS PRN Amin, Ankit Chirag, MD      . simvastatin (ZOCOR) tablet 10 mg  10 mg Oral q1800 Hollice Gong, Mir Mohammed, MD   10 mg at 10/03/19 2116  . traZODone (DESYREL) tablet 50 mg  50 mg Oral QHS Hollice Gong, Mir [place], MD   50 mg at 10/03/19 2116     Discharge Medications: Please see discharge summary for a list of discharge medications.  Relevant Imaging Results:  Relevant Lab Results:   Additional Information Pt.SSN: [id number]  Ninfa Meeker, RN

## 2019-10-05 DIAGNOSIS — R0902 Hypoxemia: Secondary | ICD-10-CM

## 2019-10-05 LAB — COMPREHENSIVE METABOLIC PANEL
ALT: 29 U/L (ref 0–44)
AST: 28 U/L (ref 15–41)
Albumin: 3.6 g/dL (ref 3.5–5.0)
Alkaline Phosphatase: 103 U/L (ref 38–126)
Anion gap: 13 (ref 5–15)
BUN: 19 mg/dL (ref 8–23)
CO2: 27 mmol/L (ref 22–32)
Calcium: 8.7 mg/dL — ABNORMAL LOW (ref 8.9–10.3)
Chloride: 99 mmol/L (ref 98–111)
Creatinine, Ser: 0.65 mg/dL (ref 0.44–1.00)
GFR calc Af Amer: >60 mL/min (ref >60)
GFR calc non Af Amer: >60 mL/min (ref >60)
Glucose, Bld: 115 mg/dL — ABNORMAL HIGH (ref 70–99)
Potassium: 3.6 mmol/L (ref 3.5–5.1)
Sodium: 139 mmol/L (ref 135–145)
Total Bilirubin: 0.7 mg/dL (ref 0.3–1.2)
Total Protein: 6.7 g/dL (ref 6.5–8.1)

## 2019-10-05 LAB — GLUCOSE, CAPILLARY
Glucose-Capillary: 107 mg/dL — ABNORMAL HIGH (ref 70–99)
Glucose-Capillary: 261 mg/dL — ABNORMAL HIGH (ref 70–99)
Glucose-Capillary: 266 mg/dL — ABNORMAL HIGH (ref 70–99)
Glucose-Capillary: 246 mg/dL — ABNORMAL HIGH (ref 70–99)

## 2019-10-05 LAB — CBC
HCT: 41.5 % (ref 36.0–46.0)
Hemoglobin: 13.6 g/dL (ref 12.0–15.0)
MCH: 31.4 pg (ref 26.0–34.0)
MCHC: 32.8 g/dL (ref 30.0–36.0)
MCV: 95.8 fL (ref 80.0–100.0)
Platelets: 164 K/uL (ref 150–400)
RBC: 4.33 MIL/uL (ref 3.87–5.11)
RDW: 12.6 % (ref 11.5–15.5)
WBC: 2.9 K/uL — ABNORMAL LOW (ref 4.0–10.5)
nRBC: 0 % (ref 0.0–0.2)

## 2019-10-05 LAB — LACTATE DEHYDROGENASE: LDH: 201 U/L — ABNORMAL HIGH (ref 98–192)

## 2019-10-05 LAB — CULTURE, BLOOD (ROUTINE X 2)
Culture: NO GROWTH
Special Requests: ADEQUATE

## 2019-10-05 LAB — MAGNESIUM: Magnesium: 1.9 mg/dL (ref 1.7–2.4)

## 2019-10-05 LAB — C-REACTIVE PROTEIN: CRP: 2.7 mg/dL — ABNORMAL HIGH (ref <1.0)

## 2019-10-05 LAB — FERRITIN: Ferritin: 1309 ng/mL — ABNORMAL HIGH (ref 11–307)

## 2019-10-05 LAB — D-DIMER, QUANTITATIVE: D-Dimer, Quant: 0.61 ug/mL-FEU — ABNORMAL HIGH (ref 0.00–0.50)

## 2019-10-05 NOTE — Plan of Care (Signed)

## 2019-10-05 NOTE — Progress Notes (Addendum)
Subjective:  I did not examine pt in person   Antibiotics:  Anti-infectives (From admission, onward)   Start     Dose/Rate Route Frequency Ordered Stop   10/04/19 1500  ceFAZolin (ANCEF) IVPB 2g/100 mL premix     2 g 200 mL/hr over 30 Minutes Intravenous Every 8 hours 10/04/19 1454     10/03/19 1200  vancomycin (VANCOCIN) IVPB 1000 mg/200 mL premix  Status:  Discontinued     1,000 mg 200 mL/hr over 60 Minutes Intravenous Every 24 hours 10/02/19 1228 10/03/19 1419   10/02/19 1230  vancomycin (VANCOCIN) 1,750 mg in sodium chloride 0.9 % 500 mL IVPB     1,750 mg 250 mL/hr over 120 Minutes Intravenous  Once 10/02/19 1228 10/02/19 1622   10/02/19 1000  remdesivir 100 mg in sodium chloride 0.9 % 250 mL IVPB     100 mg 500 mL/hr over 30 Minutes Intravenous Every 24 hours 10/01/19 0600 10/05/19 1012   10/01/19 0645  remdesivir 200 mg in sodium chloride 0.9 % 250 mL IVPB     200 mg 500 mL/hr over 30 Minutes Intravenous Once 10/01/19 0600 10/01/19 1900      Medications: Scheduled Meds: . docusate sodium  100 mg Oral Daily  . enoxaparin (LOVENOX) injection  40 mg Subcutaneous Q24H  . fentaNYL  1 patch Transdermal Q72H  . ferrous sulfate  325 mg Oral Once per day on Mon Wed Fri  . furosemide  20 mg Oral Daily  . Gerhardt's butt cream   Topical BID  . insulin aspart  0-5 Units Subcutaneous QHS  . insulin aspart  0-9 Units Subcutaneous TID WC  . Ipratropium-Albuterol  1 puff Inhalation Q6H  . simvastatin  10 mg Oral q1800  . traZODone  50 mg Oral QHS   Continuous Infusions: .  ceFAZolin (ANCEF) IV Stopped (10/05/19 1423)   PRN Meds:.acetaminophen **OR** acetaminophen, diazepam, hydrALAZINE, polyethylene glycol, senna-docusate    Objective: Weight change:   Intake/Output Summary (Last 24 hours) at 10/05/2019 1710 Last data filed at 10/05/2019 1423 Gross per 24 hour  Intake 550 ml  Output 1200 ml  Net -650 ml   Blood pressure 123/70, pulse 69, temperature 97.8  F (36.6 C), temperature source Oral, resp. rate 16, height 5\' 5"  (1.651 m), weight 80 kg, SpO2 92 %. Temp:  [97.4 F (36.3 C)-98.4 F (36.9 C)] 97.8 F (36.6 C) (11/18 1605) Pulse Rate:  [61-69] 69 (11/18 1605) Resp:  [16-23] 16 (11/18 1605) BP: (115-144)/(65-71) 123/70 (11/18 1605) SpO2:  [92 %-93 %] 92 % (11/18 1605)  Physical Exam:  Vitals:   10/05/19 0741 10/05/19 1605  BP: 115/65 123/70  Pulse: 63 69  Resp: 19 16  Temp: 98.4 F (36.9 C) 97.8 F (36.6 C)  SpO2: 92% 92%     CBC:    BMET Recent Labs    10/04/19 0430 10/05/19 0220  NA 140 139  K 3.7 3.6  CL 104 99  CO2 27 27  GLUCOSE 96 115*  BUN 20 19  CREATININE 0.63 0.65  CALCIUM 8.5* 8.7*     Liver Panel  Recent Labs    10/04/19 0430 10/05/19 0220  PROT 6.3* 6.7  ALBUMIN 3.3* 3.6  AST 19 28  ALT 19 29  ALKPHOS 96 103  BILITOT 0.5 0.7       Sedimentation Rate No results for input(s): ESRSEDRATE in the last 72 hours. C-Reactive Protein Recent Labs    10/04/19 0430 10/05/19  0220  CRP 4.3* 2.7*    Micro Results: Recent Results (from the past 720 hour(s))  Blood culture (routine x 2)     Status: Abnormal   Collection Time: 09/30/19  5:28 PM   Specimen: BLOOD LEFT HAND  Result Value Ref Range Status   Specimen Description   Final    BLOOD LEFT HAND Performed at [facility], [place] [address] [place], [place] [address]    Special Requests   Final    BOTTLES DRAWN AEROBIC AND ANAEROBIC Blood Culture adequate volume Performed at [facility], [place] [address], [place], [place] [address]    Culture  Setup Time   Final    GRAM POSITIVE COCCI IN CLUSTERS IN BOTH AEROBIC AND ANAEROBIC BOTTLES CRITICAL RESULT CALLED TO, READ BACK BY AND VERIFIED WITH: PHARMD Duncan [id number] FCP   Performed at [facility], [place] [address], [place], [place] [address]    Culture STAPHYLOCOCCUS AUREUS (A)  Final   Report Status 10/03/2019  FINAL  Final   Organism ID, Bacteria STAPHYLOCOCCUS AUREUS  Final      Susceptibility   Staphylococcus aureus - MIC*    CIPROFLOXACIN <=0.5 SENSITIVE Sensitive     ERYTHROMYCIN <=0.25 SENSITIVE Sensitive     GENTAMICIN <=0.5 SENSITIVE Sensitive     OXACILLIN <=0.25 SENSITIVE Sensitive     TETRACYCLINE <=1 SENSITIVE Sensitive     VANCOMYCIN <=0.5 SENSITIVE Sensitive     TRIMETH/SULFA <=10 SENSITIVE Sensitive     CLINDAMYCIN <=0.25 SENSITIVE Sensitive     RIFAMPIN <=0.5 SENSITIVE Sensitive     Inducible Clindamycin NEGATIVE Sensitive     * STAPHYLOCOCCUS AUREUS  SARS CORONAVIRUS 2 (TAT 6-24 HRS) Nasopharyngeal Nasopharyngeal Swab     Status: Abnormal   Collection Time: 09/30/19  6:28 PM   Specimen: Nasopharyngeal Swab  Result Value Ref Range Status   SARS Coronavirus 2 POSITIVE (A) NEGATIVE Final    Comment: RESULT CALLED TO, READ BACK BY AND VERIFIED WITH: C.RAZILLE RN [id number] 10/01/2019 MCCORMICK K (NOTE) SARS-CoV-2 target nucleic acids are DETECTED. The SARS-CoV-2 RNA is generally detectable in upper and lower respiratory specimens during the acute phase of infection. Positive results are indicative of active infection with SARS-CoV-2. Clinical  correlation with patient history and other diagnostic information is necessary to determine patient infection status. Positive results do  not rule out bacterial infection or co-infection with other viruses. The expected result is Negative. Fact Sheet for Patients: SugarRoll.be Fact Sheet for Healthcare Providers: https://www.woods-mathews.com/ This test is not yet approved or cleared by the [place] FDA and  has been authorized for detection and/or diagnosis of SARS-CoV-2 by FDA under an Emergency Use Authorization (EUA). This EUA will remain  in effect (meaning this test can be used)  for the duration of the COVID-19 declaration under Section 564(b)(1) of the Act, 21 U.S.C. section  360bbb-3(b)(1), unless the authorization is terminated or revoked sooner. Performed at [facility], [place] [address] [place], [place] [address]   Blood culture (routine x 2)     Status: None   Collection Time: 09/30/19  7:11 PM   Specimen: BLOOD LEFT ARM  Result Value Ref Range Status   Specimen Description   Final    BLOOD LEFT ARM Performed at [facility], [place] [address] [place],  [address]    Special Requests   Final    BOTTLES DRAWN AEROBIC ONLY Blood Culture adequate volume Performed at  [facility], [place] [address], [place], [place] [address]    Culture   Final    NO GROWTH 5 DAYS Performed at [facility], [place] [address], [place], [place] [address]    Report Status 10/05/2019 FINAL  Final  MRSA PCR Screening     Status: None   Collection Time: 10/01/19 10:32 AM   Specimen: Nasal Mucosa; Nasopharyngeal  Result Value Ref Range Status   MRSA by PCR NEGATIVE NEGATIVE Final    Comment:        The GeneXpert MRSA Assay (FDA approved for NASAL specimens only), is one component of a comprehensive MRSA colonization surveillance program. It is not intended to diagnose MRSA infection nor to guide or monitor treatment for MRSA infections. Performed at [facility], [place] [address] [place], [place] [address]   Culture, blood (routine x 2)     Status: None (Preliminary result)   Collection Time: 10/03/19  6:13 AM   Specimen: BLOOD  Result Value Ref Range Status   Specimen Description BLOOD LEFT ANTECUBITAL  Final   Special Requests AEROBIC BOTTLE ONLY Blood Culture adequate volume  Final   Culture   Final    NO GROWTH 2 DAYS Performed at [facility], [place] [address], [place], [place] [address]    Report Status PENDING  Incomplete  Culture, blood (routine x 2)     Status: None (Preliminary result)   Collection Time: 10/03/19  6:14 AM   Specimen: BLOOD LEFT HAND  Result Value Ref  Range Status   Specimen Description   Final    BLOOD LEFT HAND Performed at [facility], [place] [address], [place], [place] [address]    Special Requests   Final    NONE Performed at [facility], [place] [address], [place], [place] [address]    Culture   Final    NO GROWTH 2 DAYS Performed at [place] [address], [place],  [address]    Report Status PENDING  Incomplete    Studies/Results: No results found.    Assessment/Plan:  INTERVAL HISTORY:  Repeat blood cultures taken   Principal Problem:   Acute respiratory failure with hypoxia (HCC) Active Problems:   Cardiac murmur   Fever   Thrombocytopenia (HCC)   COVID-19 virus infection   Lab test positive for detection of COVID-19 virus   Bacteremia due to Staphylococcus aureus   Cellulitis of right upper extremity    Monique King is a 85 y.o. female with  Hx of breast cancer admitted with COVID-19 pnejumonia but has also now been found to have MSSA in 1/2 blood cultures  #1 MSSA bacteremia:       [facility] Staphylococcus aureus bacteremia   Staphylococcus aureus bacteremia (SAB) is associated with a high rate of complications and mortality.  Specific aspects of clinical management are critical to optimizing the outcome of patients with SAB.  Therefore, the [facility] Antimicrobial Management Team [facility]) has initiated an intervention aimed at improving the management of SAB at [facility].  To do so, Infectious Diseases physicians are providing an evidence-based consult for the management of all patients with SAB.     Yes No Comments  Perform follow-up blood cultures (even if the patient is afebrile) to ensure clearance of bacteremia [x]  []  Repeat blood cultures taken  Remove vascular catheter and obtain follow-up blood cultures after the removal of the catheter []  []   DO NOT PLACE LINE  Perform echocardiography to evaluate for endocarditis  (transthoracic ECHO is 40-50% sensitive, TEE is > 90% sensitive) []  []  Please keep in mind, that neither test can definitively EXCLUDE endocarditis, and that should clinical suspicion remain high for endocarditis the patient should then still be treated with an "endocarditis" duration of therapy = 6 weeks TTE done but TEE not going to be done at [place] to evaluate implanted cardiac device (pacemaker, ICD) []  []    Ensure source control []  []  It sounds as if her UE where she has lymphedema and cellulitis is possible source  Investigate for "metastatic" sites of infection []  []  Does the patient have ANY symptom or physical exam finding that would suggest a deeper infection (back or neck pain that may be suggestive of vertebral osteomyelitis or epidural abscess, muscle pain that could be a symptom of pyomyositis)?  Keep in mind that for deep seeded infections MRI imaging with contrast is preferred rather than other often insensitive tests such as plain x-rays, especially early in a patient's presentation.  Change antibiotic therapy to cefazolin []  []  Beta-lactam antibiotics are preferred for MSSA due to higher cure rates.   If on Vancomycin, goal trough should be 15 - 20 mcg/mL  Estimated duration of IV antibiotic therapy:  4 week []  []  Consult case management for probably prolonged outpatient IV antibiotic therapy    My understanding is pt DC to [facility] place tomorrow  Diagnosis: MSSA Bacteremia with celluliti s and PNA  Culture Result: MSSA  No Known Allergies  OPAT Orders Discharge antibiotics:  Cefazolin   Duration:  4 weeks  End Date:  10/30/2019  [facility] Per Protocol:   Labs  weekly while on IV antibiotics: x__ CBC with differential _x_ BMP w GFR/CMP   __ Please pull PIC at completion of IV antibiotics _x_ Please leave PIC in place until doctor has virtually examined the patient  Fax weekly labs to [phone]  Clinic Follow Up Appt:   December 9th at 245 PM as an E VISIT  I need to have proper phone number to contact her at [facility] for evisit.      LOS: 4 days   Alcide Evener 10/05/2019, 5:10 PM

## 2019-10-05 NOTE — Progress Notes (Addendum)
PROGRESS NOTE    Monique King  [id number] DOB: 1932-11-21 DOA: 09/30/2019 PCP: Reymundo Poll, MD    Brief Narrative:  85 year old female who presented with hypoxemia.  She does have past medical history of breast cancer, who was found to have fever and hypoxemia.  Patient unable to give detailed history.  On her initial physical examination she was febrile 101.9 F, pulse rate 90, respiratory 22, blood pressure 146/75, oximetry 96% on supplemental oxygen.  Her lungs had Rales at the left base and right mid lung, no wheezing, heart S1-S2 present, rhythmic, abdomen soft, no lower extremity edema. Sodium 138, potassium 3.8, chloride 106, bicarb 24, glucose 163, BUN 15, creatinine 0.67, white count 3.4, hemoglobin 12.5, hematocrit 38.9, platelets 89.  SARS COVID-19 was positive.  Radiograph with right base atelectasis, faint interstitial infiltrate right upper lobe (personally reviewed), EKG 90 bpm, left axis deviation, first-degree AV block, right bundle branch block,, sinus rhythm, no ST segment T wave changes.  Patient was admitted to the hospital working diagnosis of acute hypoxic respiratory failure due to SARS COVID-19 viral pneumonia.  His hospitalization has been complicated by left forearm cellulitis and MSSA bacteremia.   Assessment & Plan:   Principal Problem:   Acute respiratory failure with hypoxia (HCC) Active Problems:   Cardiac murmur   Fever   Thrombocytopenia (HCC)   COVID-19 virus infection   Lab test positive for detection of COVID-19 virus   Bacteremia due to Staphylococcus aureus  1.  Acute hypoxic respiratory failure due to SARS COVID-19 viral pneumonia   RR: 19  Pulse oxymetry: 92%  Fi02: 21% room air.   COVID-19 Labs  Recent Labs    10/03/19 0613 10/04/19 0430 10/05/19 0220  DDIMER  --  0.63* 0.61*  FERRITIN 1,294* 1,193* 1,309*  LDH 180 178 201*  CRP 7.5* 4.3* 2.7*    Lab Results  Component Value Date   SARSCOV2NAA POSITIVE (A) 09/30/2019    Inflammatory markers are trending down.   Patient completed today medical therapy with Remdesivir #5/5. Will continue systemic corticosteroids while hospitalized. No significant dyspnea.    2. Bacteremia staph aureus. Follow on repeat blood cultures, continue antibiotic therapy with cefazolin. Right arm with lymphedema, but not frank erythema, has been negative for DVT. Cellulitis and pneumonia.   3. Aortic valve stenosis. No signs of hear failure. Continue furosemide 20 mg daily.   4. Thrombocytopenia. Stable, with  No bleeding, will continue  dvt prophylaxis.   5. Dementia. Confused but not agitates, can be redirected. Continue with diazepam.   6. Obesity. Calculated BMI is 29.3  DVT prophylaxis enoxaparin   Code Status:  dnr  Family Communication:  No family at the bedside  Disposition Plan/ discharge barriers: pending clinical improvement.   Body mass index is 29.35 kg/m. Malnutrition Type:      Malnutrition Characteristics:      Nutrition Interventions:     RN Pressure Injury Documentation:     Consultants:     Procedures:     Antimicrobials:    cafazolin.    Subjective: Patient feeling well, but continue to be very weak and deconditioned, no nausea or vomiting, no chest pain or dyspnea. Positive edema on her left upper extremity.   Objective: Vitals:   10/04/19 1615 10/04/19 2206 10/05/19 0500 10/05/19 0741  BP: 135/74 (!) 144/71  115/65  Pulse: 67  61 63  Resp: 20  (!) 23 19  Temp: 99.2 F (37.3 C) (!) 97.4 F (36.3 C) 97.9 F (36.6 C)  98.4 F (36.9 C)  TempSrc: Oral Axillary Axillary Oral  SpO2: 93% 93% 92% 92%  Weight:      Height:        Intake/Output Summary (Last 24 hours) at 10/05/2019 1011 Last data filed at 10/05/2019 0646 Gross per 24 hour  Intake 550 ml  Output 1200 ml  Net -650 ml   Filed Weights   10/01/19 1034 10/04/19 0510  Weight: 85.5 kg 80 kg    Examination:   General: Not in pain or dyspnea,  deconditioned  Neurology: Awake and alert, non focal  E ENT: positive pallor, no icterus, oral mucosa moist Cardiovascular: No JVD. S1-S2 present, rhythmic, no gallops, rubs, or murmurs. No lower extremity edema. Pulmonary: vesicular breath sounds bilaterally. Gastrointestinal. Abdomen with no organomegaly, non tender, no rebound or guarding Skin. Ecchymosis on inner arm, non tender or increased local temperature.  Musculoskeletal: no joint deformities     Data Reviewed: I have personally reviewed following labs and imaging studies  CBC: Recent Labs  Lab 09/30/19 1728 10/01/19 0435 10/03/19 0613 10/04/19 0430 10/05/19 0220  WBC 3.4* 4.1 2.1* 2.0* 2.9*  NEUTROABS 2.9  --   --   --   --   HGB 12.5 11.2* 12.2 11.9* 13.6  HCT 38.9 35.5* 38.9 36.6 41.5  MCV 100.3* 100.9* 100.3* 96.6 95.8  PLT 89* 106* 127* 135* [id number]   Basic Metabolic Panel: Recent Labs  Lab 09/30/19 1911 10/01/19 0435 10/03/19 0613 10/04/19 0430 10/05/19 0220  NA 138 139 140 140 139  K 3.8 3.9 3.4* 3.7 3.6  CL 106 104 105 104 99  CO2 24 26 25 27 27   GLUCOSE 163* 144* 109* 96 115*  BUN 15 11 23 20 19   CREATININE 0.67 0.64 0.77 0.63 0.65  CALCIUM 7.9* 8.2* 8.5* 8.5* 8.7*  MG  --   --  1.9 1.9 1.9   GFR: Estimated Creatinine Clearance: 54.7 mL/min (by C-G formula based on SCr of 0.65 mg/dL). Liver Function Tests: Recent Labs  Lab 09/30/19 1911 10/01/19 0435 10/03/19 0613 10/04/19 0430 10/05/19 0220  AST 21 21 18 19 28   ALT 19 17 18 19 29   ALKPHOS 120 115 106 96 103  BILITOT 0.6 0.8 0.5 0.5 0.7  PROT 6.6 6.4* 6.7 6.3* 6.7  ALBUMIN 3.7 3.6 3.5 3.3* 3.6   No results for input(s): LIPASE, AMYLASE in the last 168 hours. No results for input(s): AMMONIA in the last 168 hours. Coagulation Profile: No results for input(s): INR, PROTIME in the last 168 hours. Cardiac Enzymes: Recent Labs  Lab 10/01/19 0435  CKTOTAL 62  CKMB 1.0   BNP (last 3 results) No results for input(s): PROBNP in the  last 8760 hours. HbA1C: No results for input(s): HGBA1C in the last 72 hours. CBG: Recent Labs  Lab 10/04/19 0759 10/04/19 1219 10/04/19 1600 10/04/19 2226 10/05/19 0743  GLUCAP 96 198* 270* 197* 107*   Lipid Profile: No results for input(s): CHOL, HDL, LDLCALC, TRIG, CHOLHDL, LDLDIRECT in the last 72 hours. Thyroid Function Tests: No results for input(s): TSH, T4TOTAL, FREET4, T3FREE, THYROIDAB in the last 72 hours. Anemia Panel: Recent Labs    10/04/19 0430 10/05/19 0220  FERRITIN 1,193* 1,309*      Radiology Studies: I have reviewed all of the imaging during this hospital visit personally     Scheduled Meds: . docusate sodium  100 mg Oral Daily  . enoxaparin (LOVENOX) injection  40 mg Subcutaneous Q24H  . fentaNYL  1 patch Transdermal Q72H  .  ferrous sulfate  325 mg Oral Once per day on Mon Wed Fri  . furosemide  20 mg Oral Daily  . Gerhardt's butt cream   Topical BID  . insulin aspart  0-5 Units Subcutaneous QHS  . insulin aspart  0-9 Units Subcutaneous TID WC  . Ipratropium-Albuterol  1 puff Inhalation Q6H  . methylPREDNISolone (SOLU-MEDROL) injection  40 mg Intravenous Daily  . simvastatin  10 mg Oral q1800  . traZODone  50 mg Oral QHS   Continuous Infusions: .  ceFAZolin (ANCEF) IV Stopped (10/05/19 [id number])  . remdesivir 100 mg in NS 250 mL 100 mg (10/05/19 0942)     LOS: 4 days        Clotilde Loth Gerome Apley, MD

## 2019-10-05 NOTE — Progress Notes (Signed)
Physical Therapy Treatment Patient Details Name: Monique King MRN: [id number] DOB: [birth year] Today's Date: 10/05/2019    History of Present Illness Pt adm with acute respiratory failure due to covid PNA. 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 She was noted to be moving RLE and RUE using LUE. Sitting EOB does well and is able to sit unsupported briefly, completed sit<>stand with max a and cues using gait belt, stand pivot to recliner with max a x2 using gait gelt. Pt on room air throughout tx.    Follow Up Recommendations  SNF     Equipment Recommendations  None recommended by PT    Recommendations for Other Services       Precautions / Restrictions Precautions Precautions: Fall Precaution Comments: R side limited movement Restrictions Weight Bearing Restrictions: No    Mobility  Bed Mobility Overal bed mobility: Needs Assistance Bed Mobility: Supine to Sit     Supine to sit: Mod assist     General bed mobility comments: assist with moving RLE forward to get to EOB  Transfers Overall transfer level: Needs assistance Equipment used: 2 person hand held assist Transfers: Sit to/from Stand;Stand Pivot Transfers Sit to Stand: Max assist Stand pivot transfers: +2 physical assistance;Max assist       General transfer comment: does well with initiation but half way gives up  Ambulation/Gait                 Stairs             Wheelchair Mobility    Modified Rankin (Stroke Patients Only)       Balance                                            Cognition Arousal/Alertness: Awake/alert Behavior During Therapy: WFL for tasks assessed/performed Overall Cognitive Status: No family/caregiver present to  determine baseline cognitive functioning                                        Exercises      General Comments        Pertinent Vitals/Pain Pain Assessment: Faces Faces Pain Scale: Hurts whole lot Pain Location: w/ mobility, in RUE Pain Descriptors / Indicators: Guarding;Grimacing;Discomfort Pain Intervention(s): Limited activity within patient's tolerance    Home Living                      Prior Function            PT Goals (current goals can now be found in the care plan section) Acute Rehab PT Goals PT Goal Formulation: With patient Time For Goal Achievement: 10/17/19 Potential to Achieve Goals: Fair Progress towards PT goals: Progressing toward goals    Frequency    Min 2X/week      PT Plan Discharge plan needs to be updated    Co-evaluation              AM-PAC PT "6 Clicks" Mobility   Outcome Measure  Help needed turning from your back  to your side while in a flat bed without using bedrails?: A Lot Help needed moving from lying on your back to sitting on the side of a flat bed without using bedrails?: A Lot Help needed moving to and from a bed to a chair (including a wheelchair)?: Total Help needed standing up from a chair using your arms (e.g., wheelchair or bedside chair)?: A Lot Help needed to walk in hospital room?: Total Help needed climbing 3-5 steps with a railing? : Total 6 Click Score: 9    End of Session Equipment Utilized During Treatment: Gait belt Activity Tolerance: Treatment limited secondary to medical complications (Comment);Patient limited by pain Patient left: in chair;with call bell/phone within reach Nurse Communication: (nurse in room) PT Visit Diagnosis: Other abnormalities of gait and mobility (R26.89);Muscle weakness (generalized) (M62.81)     Time: [id number] PT Time Calculation (min) (ACUTE ONLY): 12 min  Charges:  $Therapeutic Activity: 8-22 mins                     Horald Chestnut,  PT    Delford Field 10/05/2019, 4:42 PM

## 2019-10-05 NOTE — TOC Progression Note (Signed)
Transition of Care [facility]) - Progression Note    Patient Details  Name: Monique King MRN: [id number] Date of Birth: 08-Dec-1932  Transition of Care [facility]) CM/SW Contact  Ninfa Meeker, RN Phone Number: [phone] (working remotely) 10/05/2019, 12:06 PM  Clinical Narrative:   Patient has bed available at [facility]. Will discharge on 10/06/19, receiving last dose of Remdesivir today.Patient's daughter is aware.     Expected Discharge Plan: [place] Barriers to Discharge: No Barriers Identified  Expected Discharge Plan and Services Expected Discharge Plan: [place]   Discharge Planning Services: CM Consult Post Acute Care Choice: [place] Living arrangements for the past 2 months: [place]                                       Social Determinants of Health (SDOH) Interventions    Readmission Risk Interventions No flowsheet data found.

## 2019-10-06 ENCOUNTER — Inpatient Hospital Stay: Payer: Self-pay

## 2019-10-06 LAB — COMPREHENSIVE METABOLIC PANEL
ALT: 21 U/L (ref 0–44)
AST: 20 U/L (ref 15–41)
Albumin: 3.3 g/dL — ABNORMAL LOW (ref 3.5–5.0)
Alkaline Phosphatase: 92 U/L (ref 38–126)
Anion gap: 13 (ref 5–15)
BUN: 21 mg/dL (ref 8–23)
CO2: 25 mmol/L (ref 22–32)
Calcium: 8.2 mg/dL — ABNORMAL LOW (ref 8.9–10.3)
Chloride: 97 mmol/L — ABNORMAL LOW (ref 98–111)
Creatinine, Ser: 0.7 mg/dL (ref 0.44–1.00)
GFR calc Af Amer: >60 mL/min (ref >60)
GFR calc non Af Amer: >60 mL/min (ref >60)
Glucose, Bld: 112 mg/dL — ABNORMAL HIGH (ref 70–99)
Potassium: 3.7 mmol/L (ref 3.5–5.1)
Sodium: 135 mmol/L (ref 135–145)
Total Bilirubin: 0.8 mg/dL (ref 0.3–1.2)
Total Protein: 6.2 g/dL — ABNORMAL LOW (ref 6.5–8.1)

## 2019-10-06 LAB — GLUCOSE, CAPILLARY
Glucose-Capillary: 134 mg/dL — ABNORMAL HIGH (ref 70–99)
Glucose-Capillary: 263 mg/dL — ABNORMAL HIGH (ref 70–99)
Glucose-Capillary: 230 mg/dL — ABNORMAL HIGH (ref 70–99)

## 2019-10-06 MED ORDER — CEFAZOLIN SODIUM-DEXTROSE 2-4 GM/100ML-% IV SOLN: 2.0000 g | Freq: Three times a day (TID) | INTRAVENOUS | 0 refills | Status: AC

## 2019-10-06 MED ORDER — FENTANYL 25 MCG/HR TD PT72: 1.0000 | MEDICATED_PATCH | TRANSDERMAL | 0 refills | Status: DC

## 2019-10-06 MED ORDER — FUROSEMIDE 20 MG PO TABS: 20.0000 mg | ORAL_TABLET | Freq: Every day | ORAL | 0 refills | Status: DC | PRN

## 2019-10-06 MED ORDER — DIAZEPAM 2 MG PO TABS: 5.0000 mg | ORAL_TABLET | Freq: Every evening | ORAL | 0 refills | Status: DC | PRN

## 2019-10-06 MED ORDER — CHLORHEXIDINE GLUCONATE CLOTH 2 % EX PADS: 6.0000 | MEDICATED_PAD | Freq: Every day | CUTANEOUS | Status: DC

## 2019-10-06 NOTE — Discharge Summary (Addendum)
Physician Discharge Summary  Monique King [id number] DOB: 1933/03/07 DOA: 09/30/2019  PCP: Reymundo Poll, MD  Admit date: 09/30/2019 Discharge date: 10/06/2019  Admitted From: Home  Disposition:  SNF   Recommendations for Outpatient Follow-up and new medication changes:  1. Follow up with Dr. Shona Simpson in 2 weeks.  2. Change furosemide to take only as needed for edema or dyspnea.  3. Continue antibiotic therapy with Cefazolin for total 4 weeks, with end date on 10/30/19.  4. Please obtain weekly complete metabolic panel, complete blood count with differential while on IV antibiotics.  5. Please leave PIC in place until doctor (infectious diease) has virtually examined the patient.   Fax weekly labs to [phone]  Clinic Follow Up Appt:  December 9th at 245 PM as an [place]: na   Equipment/Devices: na    Discharge Condition: stable CODE STATUS: DNR. Diet recommendation:  Heart healthy and diabetic prudent.   Brief/Interim Summary: 85 year old female who presented with hypoxemia.  She does have past medical history of breast cancer, who was found to have fever and hypoxemia.  Patient unable to give detailed history.  On her initial physical examination she was febrile 101.9 F, pulse rate 90, respiratory rate 22, blood pressure 146/75, oximetry 96% on supplemental oxygen.  Her lungs had rales at the left base and right mid lung, no wheezing, heart S1-S2 present, rhythmic, abdomen soft, no lower extremity edema, positive right upper extremity chronic lymphedema. Sodium 138, potassium 3.8, chloride 106, bicarb 24, glucose 163, BUN 15, creatinine 0.67, white count 3.4, hemoglobin 12.5, hematocrit 38.9, platelets 89.  SARS COVID-19 was positive.  Radiograph with right base atelectasis, faint interstitial infiltrate right upper lobe (personally reviewed), EKG 90 bpm, left axis deviation, first-degree AV block, right bundle branch block,, sinus rhythm, no ST segment or T  wave changes.  Patient was admitted to the hospital with a working diagnosis of acute hypoxic respiratory failure due to SARS COVID-19 viral pneumonia.  His hospitalization has been complicated by right forearm cellulitis and MSSA bacteremia.   1.  Acute hypoxic respiratory failure due to SARS COVID-19 viral pneumonia.  Patient was admitted to the medical ward, she received supplemental oxygen per nasal cannula, intravenous corticosteroids and remdesivir.  Bronchodilators and airway clearaning techniques with flutter valve and incentive spirometer.  Her inflammatory markers and symptoms improved.  Her oxygenation discharge is 91 to 92% on room air.  Patient very weak and deconditioned, physical therapy recommended skilled nursing facility.  2.  Right upper extremity cellulitis/bacterial pneumonia, complicated by staph aureus bacteremia/ present on admission.  Patient was noted to have right upper extremity cellulitis, blood culture positive for MSSA, further work-up with echocardiography showed no vegetation, preserved LV systolic function.  Follow-up blood cultures from November 16 continue with no growth.  Infectious disease was consulted, recommendations to complete antibiotic therapy with cefazolin for 4 weeks. Scheduled end date antibiotics on December 13.  Follow-up with infectious disease clinic.  3.  Thrombocytopenia.  Likely multifactorial.  Risk versus benefit patient continued to receive DVT prophylaxis while hospitalized.  Ultrasonography right upper extremity negative for deep vein thrombosis.  Discharge platelets 164.  4.  Dementia.  No agitation, continue trazodone and diazepam.  5.  Obesity.  Calculated BMI 39.3.  6.  Moderate arctic valve stenosis, LV systolic function preserved.  7. HTN. Continue blood pressure monitoring, will change furosemide only as needed for now.   8. T2DM with dyslipidemia. Continue glucose control with glipizide, patient was placed  on insulin sliding  scale while hospitalized.  Continue simvastatin.  9.  Iron deficiency anemia.  Continue iron supplements.  I spoke with patient's daughter over the phone and all questions were addressed.  Discharge Diagnoses:  Principal Problem:   Acute respiratory failure with hypoxia [facility]) Active Problems:   Cardiac murmur   Fever   Thrombocytopenia (HCC)   COVID-19 virus infection   Lab test positive for detection of COVID-19 virus   Bacteremia due to Staphylococcus aureus    Discharge Instructions   Allergies as of 10/06/2019   No Known Allergies     Medication List    TAKE these medications   acetaminophen 325 MG tablet Commonly known as: TYLENOL Take 325 mg by mouth 2 (two) times daily.   ceFAZolin 2-4 GM/100ML-% IVPB Commonly known as: ANCEF Inject 100 mLs (2 g total) into the vein every 8 (eight) hours for 24 days. Continue antibiotic therapy until 10/30/19, please keep PIC line until follow up with infectious disease clinic.   Dermacloud Crea Apply 1 application topically 2 (two) times daily.   diazepam 2 MG tablet Commonly known as: VALIUM Take 2.5 tablets (5 mg total) by mouth at bedtime as needed for anxiety. What changed: how much to take   docusate sodium 100 MG capsule Commonly known as: COLACE Take 100 mg by mouth daily.   fentaNYL 25 MCG/HR Commonly known as: Vidor 1 patch onto the skin every 3 (three) days. Start taking on: October 07, 2019   ferrous sulfate 325 (65 FE) MG EC tablet Take 325 mg by mouth See admin instructions. Every Monday, Wednesday, and Friday.   furosemide 20 MG tablet Commonly known as: LASIX Take 1 tablet (20 mg total) by mouth daily as needed for fluid (dyspnea.). What changed:   when to take this  reasons to take this   glipiZIDE 5 MG tablet Commonly known as: GLUCOTROL Take 2.5 mg by mouth daily before breakfast.   ketoconazole 2 % cream Commonly known as: NIZORAL Apply 1 application topically at bedtime.  Left breast and bottom.   mometasone 0.1 % ointment Commonly known as: ELOCON Apply 1 application topically 2 (two) times daily. To affected areas on the body and ears.   selenium sulfide 2.5 % shampoo Commonly known as: SELSUN Apply 1 application topically every other day.   simvastatin 10 MG tablet Commonly known as: ZOCOR Take 10 mg by mouth daily.   traZODone 50 MG tablet Commonly known as: DESYREL Take 50 mg by mouth at bedtime.   triamcinolone 0.025 % cream Commonly known as: KENALOG Apply 1 application topically daily as needed. To left breast.       No Known Allergies  Consultations:  ID    Procedures/Studies: Ct Angio Chest Pe W Or Wo Contrast  Result Date: 10/02/2019 CLINICAL DATA:  Chest pain EXAM: CT ANGIOGRAPHY CHEST WITH CONTRAST TECHNIQUE: Multidetector CT imaging of the chest was performed using the standard protocol during bolus administration of intravenous contrast. Multiplanar CT image reconstructions and MIPs were obtained to evaluate the vascular anatomy. CONTRAST:  156mL OMNIPAQUE IOHEXOL 350 MG/ML SOLN COMPARISON:  None. FINDINGS: Cardiovascular: Satisfactory opacification of the pulmonary arteries to the segmental level. No evidence of pulmonary embolism. Normal heart size. Scattered coronary artery calcifications. No pericardial effusion. Aortic atherosclerosis. 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 Mediastinum/Nodes: No enlarged mediastinal, hilar, or axillary lymph nodes. Thyroid gland, trachea, and esophagus demonstrate no significant findings. Lungs/Pleura: Trace bilateral pleural effusions. There are  scattered, nonspecific bilateral ground-glass opacities, most numerous in the right middle lobe (series 6, image 73). There is bandlike scarring of the lung bases. Subpleural fibrotic scarring of the anterior apex (series 6, image 20). Upper Abdomen: No acute abnormality. Musculoskeletal: Status post radical  right mastectomy. Osteopenia. Chronic appearing fracture deformity of the proximal right humerus. Review of the MIP images confirms the above findings. IMPRESSION: 1.  Negative examination for pulmonary embolism. 2. There are scattered, nonspecific bilateral ground-glass opacities, most numerous in the right middle lobe (series 6, image 73), infectious or inflammatory. 3.  Trace bilateral pleural effusions. 4. Postoperative and post radiation findings of right breast malignancy. 5.  Coronary artery disease.  Aortic Atherosclerosis (ICD10-I70.0). Electronically Signed   By: Eddie Candle M.D.   On: 10/02/2019 12:48   Dg Chest Portable 1 View  Result Date: 09/30/2019 CLINICAL DATA:  Cough and shortness of breath EXAM: PORTABLE CHEST 1 VIEW COMPARISON:  None. FINDINGS: Cardiac shadows within normal limits. Tortuous thoracic aorta is noted. Lungs are hypoinflated with mild crowding of the vascular markings. No focal confluent infiltrate is seen. Prior clavicular fracture is noted with some dystrophic calcification. No acute bony abnormality is noted. IMPRESSION: No acute abnormality noted. Electronically Signed   By: Inez Catalina M.D.   On: 09/30/2019 19:51   Vas [place] Upper Extremity Venous Duplex  Result Date: 10/04/2019 UPPER VENOUS STUDY  Indications: Edema, and Patient has had right arm lymphedema since 1974, status post mastectomy Limitations: Edema, patient unable to move arm. Comparison Study: No prior study on file Performing Technologist: Sharion Dove RVS  Examination Guidelines: A complete evaluation includes B-mode imaging, spectral Doppler, color Doppler, and power Doppler as needed of all accessible portions of each vessel. Bilateral testing is considered an integral part of a complete examination. Limited examinations for reoccurring indications may be performed as noted.  Right Findings: +----------+------------+---------+-----------+----------+-------+ RIGHT      CompressiblePhasicitySpontaneousPropertiesSummary +----------+------------+---------+-----------+----------+-------+ IJV           Full       Yes       Yes                      +----------+------------+---------+-----------+----------+-------+ Subclavian    Full       Yes       Yes                      +----------+------------+---------+-----------+----------+-------+ Axillary                 Yes       Yes                      +----------+------------+---------+-----------+----------+-------+ Brachial                 Yes       Yes                      +----------+------------+---------+-----------+----------+-------+ Cephalic      Full                                          +----------+------------+---------+-----------+----------+-------+ Basilic       Full                                          +----------+------------+---------+-----------+----------+-------+  Left Findings: +----------+------------+---------+-----------+----------+-------+ LEFT      CompressiblePhasicitySpontaneousPropertiesSummary +----------+------------+---------+-----------+----------+-------+ Subclavian               Yes       Yes                      +----------+------------+---------+-----------+----------+-------+  Summary:  Right: No evidence of deep vein thrombosis in the upper extremity. However, unable to visualize the radial ulnar. No evidence of superficial vein thrombosis in the upper extremity. However, unable to visualize the radial ulnar. No evidence of thrombosis in the . However, unable to visualize the radial ulnar. This was a limited study.  Left: No evidence of thrombosis in the subclavian.  *See table(s) above for measurements and observations.  Diagnosing physician: Deitra Mayo MD Electronically signed by Deitra Mayo MD on 10/04/2019 at 1:08:55 PM.    Final    [place] Ekg Site Rite  Result Date: 10/06/2019 If Site Rite image not attached,  placement could not be confirmed due to current cardiac rhythm.     Procedures: PIC line   Subjective: Patient is feeling well, no dyspnea or chest pain, no nausea or vomiting.   Discharge Exam: Vitals:   10/05/19 2107 10/06/19 0515  BP: 129/83 119/64  Pulse: 65 60  Resp: 15   Temp: 97.7 F (36.5 C) 97.8 F (36.6 C)  SpO2: 91%    Vitals:   10/05/19 0741 10/05/19 1605 10/05/19 2107 10/06/19 0515  BP: 115/65 123/70 129/83 119/64  Pulse: 63 69 65 60  Resp: 19 16 15    Temp: 98.4 F (36.9 C) 97.8 F (36.6 C) 97.7 F (36.5 C) 97.8 F (36.6 C)  TempSrc: Oral Oral  Oral  SpO2: 92% 92% 91%   Weight:    78 kg  Height:        General: Not in pain or dyspnea.  Neurology: Awake and alert, non focal  E ENT: no pallor, no icterus, oral mucosa moist Cardiovascular: No JVD. S1-S2 present, rhythmic, no gallops, rubs, or murmurs. No lower extremity edema. Pulmonary: vesicular breath sounds bilaterally. Gastrointestinal. Abdomen protuberant with no organomegaly, non tender, no rebound or guarding Skin. Right upper extremity with edema but not frank erythema.  Musculoskeletal: no joint deformities   The results of significant diagnostics from this hospitalization (including imaging, microbiology, ancillary and laboratory) are listed below for reference.     Microbiology: Recent Results (from the past 240 hour(s))  Blood culture (routine x 2)     Status: Abnormal   Collection Time: 09/30/19  5:28 PM   Specimen: BLOOD LEFT HAND  Result Value Ref Range Status   Specimen Description   Final    BLOOD LEFT HAND Performed at [facility], [address] [place], [place] [address]    Special Requests   Final    BOTTLES DRAWN AEROBIC AND ANAEROBIC Blood Culture adequate volume Performed at [facility], [place] [address] [place], [place] [address]    Culture  Setup Time   Final    GRAM POSITIVE COCCI IN CLUSTERS IN BOTH AEROBIC AND  ANAEROBIC BOTTLES CRITICAL RESULT CALLED TO, READ BACK BY AND VERIFIED WITH: PHARMD Camden [id number] FCP   Performed at [facility], [place] [address] [place], [place] [address]    Culture STAPHYLOCOCCUS AUREUS (A)  Final   Report Status 10/03/2019 FINAL  Final   Organism ID, Bacteria STAPHYLOCOCCUS AUREUS  Final      Susceptibility  Staphylococcus aureus - MIC*    CIPROFLOXACIN <=0.5 SENSITIVE Sensitive     ERYTHROMYCIN <=0.25 SENSITIVE Sensitive     GENTAMICIN <=0.5 SENSITIVE Sensitive     OXACILLIN <=0.25 SENSITIVE Sensitive     TETRACYCLINE <=1 SENSITIVE Sensitive     VANCOMYCIN <=0.5 SENSITIVE Sensitive     TRIMETH/SULFA <=10 SENSITIVE Sensitive     CLINDAMYCIN <=0.25 SENSITIVE Sensitive     RIFAMPIN <=0.5 SENSITIVE Sensitive     Inducible Clindamycin NEGATIVE Sensitive     * STAPHYLOCOCCUS AUREUS  SARS CORONAVIRUS 2 (TAT 6-24 HRS) Nasopharyngeal Nasopharyngeal Swab     Status: Abnormal   Collection Time: 09/30/19  6:28 PM   Specimen: Nasopharyngeal Swab  Result Value Ref Range Status   SARS Coronavirus 2 POSITIVE (A) NEGATIVE Final    Comment: RESULT CALLED TO, READ BACK BY AND VERIFIED WITH: C.RAZILLE RN [id number] 10/01/2019 MCCORMICK K (NOTE) SARS-CoV-2 target nucleic acids are DETECTED. The SARS-CoV-2 RNA is generally detectable in upper and lower respiratory specimens during the acute phase of infection. Positive results are indicative of active infection with SARS-CoV-2. Clinical  correlation with patient history and other diagnostic information is necessary to determine patient infection status. Positive results do  not rule out bacterial infection or co-infection with other viruses. The expected result is Negative. Fact Sheet for Patients: SugarRoll.be Fact Sheet for Healthcare Providers: https://www.woods-mathews.com/ This test is not yet approved or cleared by the [place] FDA and  has been authorized for  detection and/or diagnosis of SARS-CoV-2 by FDA under an Emergency Use Authorization (EUA). This EUA will remain  in effect (meaning this test can be used)  for the duration of the COVID-19 declaration under Section 564(b)(1) of the Act, 21 U.S.C. section 360bbb-3(b)(1), unless the authorization is terminated or revoked sooner. Performed at [facility], [place] [address] [place], [place] [address]   Blood culture (routine x 2)     Status: None   Collection Time: 09/30/19  7:11 PM   Specimen: BLOOD LEFT ARM  Result Value Ref Range Status   Specimen Description   Final    BLOOD LEFT ARM Performed at [facility], [place] [address], [place], [place] [address]    Special Requests   Final    BOTTLES DRAWN AEROBIC ONLY Blood Culture adequate volume Performed at [facility], [address], [place], [place] [address]    Culture   Final    NO GROWTH 5 DAYS Performed at [facility], [place] [address], [place], [place] [address]    Report Status 10/05/2019 FINAL  Final  MRSA PCR Screening     Status: None   Collection Time: 10/01/19 10:32 AM   Specimen: Nasal Mucosa; Nasopharyngeal  Result Value Ref Range Status   MRSA by PCR NEGATIVE NEGATIVE Final    Comment:        The GeneXpert MRSA Assay (FDA approved for NASAL specimens only), is one component of a comprehensive MRSA colonization surveillance program. It is not intended to diagnose MRSA infection nor to guide or monitor treatment for MRSA infections. Performed at [facility], [place] [address], [place], [place] [address]   Culture, blood (routine x 2)     Status: None (Preliminary result)   Collection Time: 10/03/19  6:13 AM   Specimen: BLOOD  Result Value Ref Range Status   Specimen Description BLOOD LEFT ANTECUBITAL  Final   Special Requests AEROBIC BOTTLE ONLY Blood Culture adequate volume  Final   Culture   Final    NO GROWTH 2 DAYS Performed  at [facility], [place] [address] [place], [place] [address]    Report Status PENDING  Incomplete  Culture, blood (routine x 2)     Status: None (Preliminary result)   Collection Time: 10/03/19  6:14 AM   Specimen: BLOOD LEFT HAND  Result Value Ref Range Status   Specimen Description   Final    BLOOD LEFT HAND Performed at [facility], [place] [address] [place], [place] [address]    Special Requests   Final    NONE Performed at [facility], [place] [address] [place], [place] [address]    Culture   Final    NO GROWTH 2 DAYS Performed at [facility] [place] [address], [place], [place] [address]    Report Status PENDING  Incomplete     Labs: BNP (last 3 results) Recent Labs    09/30/19 1728 10/02/19 0950 10/04/19 0430  BNP 38.4 62.7 [id number]   Basic Metabolic Panel: Recent Labs  Lab 10/01/19 0435 10/03/19 0613 10/04/19 0430 10/05/19 0220 10/06/19 0252  NA 139 140 140 139 135  K 3.9 3.4* 3.7 3.6 3.7  CL 104 105 104 99 97*  CO2 26 25 27 27 25   GLUCOSE 144* 109* 96 115* 112*  BUN 11 23 20 19 21   CREATININE 0.64 0.77 0.63 0.65 0.70  CALCIUM 8.2* 8.5* 8.5* 8.7* 8.2*  MG  --  1.9 1.9 1.9  --    Liver Function Tests: Recent Labs  Lab 10/01/19 0435 10/03/19 0613 10/04/19 0430 10/05/19 0220 10/06/19 0252  AST 21 18 19 28 20   ALT 17 18 19 29 21   ALKPHOS 115 106 96 103 92  BILITOT 0.8 0.5 0.5 0.7 0.8  PROT 6.4* 6.7 6.3* 6.7 6.2*  ALBUMIN 3.6 3.5 3.3* 3.6 3.3*   No results for input(s): LIPASE, AMYLASE in the last 168 hours. No results for input(s): AMMONIA in the last 168 hours. CBC: Recent Labs  Lab 09/30/19 1728 10/01/19 0435 10/03/19 0613 10/04/19 0430 10/05/19 0220  WBC 3.4* 4.1 2.1* 2.0* 2.9*  NEUTROABS 2.9  --   --   --   --   HGB 12.5 11.2* 12.2 11.9* 13.6  HCT 38.9 35.5* 38.9 36.6 41.5  MCV 100.3* 100.9* 100.3* 96.6 95.8  PLT 89* 106* 127* 135* 164   Cardiac Enzymes: Recent Labs  Lab 10/01/19 0435  CKTOTAL 62   CKMB 1.0   BNP: Invalid input(s): POCBNP CBG: Recent Labs  Lab 10/04/19 2226 10/05/19 0743 10/05/19 1126 10/05/19 1622 10/05/19 2002  GLUCAP 197* 107* 261* 266* 246*   D-Dimer Recent Labs    10/04/19 0430 10/05/19 0220  DDIMER 0.63* 0.61*   Hgb A1c No results for input(s): HGBA1C in the last 72 hours. Lipid Profile No results for input(s): CHOL, HDL, LDLCALC, TRIG, CHOLHDL, LDLDIRECT in the last 72 hours. Thyroid function studies No results for input(s): TSH, T4TOTAL, T3FREE, THYROIDAB in the last 72 hours.  Invalid input(s): FREET3 Anemia work up Recent Labs    10/04/19 0430 10/05/19 0220  FERRITIN 1,193* 1,309*   Urinalysis No results found for: COLORURINE, APPEARANCEUR, LABSPEC, [place], GLUCOSEU, [place], [place], [place], PROTEINUR, UROBILINOGEN, NITRITE, LEUKOCYTESUR Sepsis Labs Invalid input(s): PROCALCITONIN,  WBC,  LACTICIDVEN Microbiology Recent Results (from the past 240 hour(s))  Blood culture (routine x 2)     Status: Abnormal   Collection Time: 09/30/19  5:28 PM  Specimen: BLOOD LEFT HAND  Result Value Ref Range Status   Specimen Description   Final    BLOOD LEFT HAND Performed at [facility], [address], [place], [place] [address]    Special Requests   Final    BOTTLES DRAWN AEROBIC AND ANAEROBIC Blood Culture adequate volume Performed at [facility], [place] [address], [place], [place] [address]    Culture  Setup Time   Final    GRAM POSITIVE COCCI IN CLUSTERS IN BOTH AEROBIC AND ANAEROBIC BOTTLES CRITICAL RESULT CALLED TO, READ BACK BY AND VERIFIED WITH: PHARMD Moody [id number] FCP   Performed at [facility], [place] [address], [place], [place] [address]    Culture STAPHYLOCOCCUS AUREUS (A)  Final   Report Status 10/03/2019 FINAL  Final   Organism ID, Bacteria STAPHYLOCOCCUS AUREUS  Final      Susceptibility   Staphylococcus aureus - MIC*    CIPROFLOXACIN <=0.5  SENSITIVE Sensitive     ERYTHROMYCIN <=0.25 SENSITIVE Sensitive     GENTAMICIN <=0.5 SENSITIVE Sensitive     OXACILLIN <=0.25 SENSITIVE Sensitive     TETRACYCLINE <=1 SENSITIVE Sensitive     VANCOMYCIN <=0.5 SENSITIVE Sensitive     TRIMETH/SULFA <=10 SENSITIVE Sensitive     CLINDAMYCIN <=0.25 SENSITIVE Sensitive     RIFAMPIN <=0.5 SENSITIVE Sensitive     Inducible Clindamycin NEGATIVE Sensitive     * STAPHYLOCOCCUS AUREUS  SARS CORONAVIRUS 2 (TAT 6-24 HRS) Nasopharyngeal Nasopharyngeal Swab     Status: Abnormal   Collection Time: 09/30/19  6:28 PM   Specimen: Nasopharyngeal Swab  Result Value Ref Range Status   SARS Coronavirus 2 POSITIVE (A) NEGATIVE Final    Comment: RESULT CALLED TO, READ BACK BY AND VERIFIED WITH: C.RAZILLE RN [id number] 10/01/2019 MCCORMICK K (NOTE) SARS-CoV-2 target nucleic acids are DETECTED. The SARS-CoV-2 RNA is generally detectable in upper and lower respiratory specimens during the acute phase of infection. Positive results are indicative of active infection with SARS-CoV-2. Clinical  correlation with patient history and other diagnostic information is necessary to determine patient infection status. Positive results do  not rule out bacterial infection or co-infection with other viruses. The expected result is Negative. Fact Sheet for Patients: SugarRoll.be Fact Sheet for Healthcare Providers: https://www.woods-mathews.com/ This test is not yet approved or cleared by the [place] FDA and  has been authorized for detection and/or diagnosis of SARS-CoV-2 by FDA under an Emergency Use Authorization (EUA). This EUA will remain  in effect (meaning this test can be used)  for the duration of the COVID-19 declaration under Section 564(b)(1) of the Act, 21 U.S.C. section 360bbb-3(b)(1), unless the authorization is terminated or revoked sooner. Performed at [facility], [place] [address], [place],  [place] [address]   Blood culture (routine x 2)     Status: None   Collection Time: 09/30/19  7:11 PM   Specimen: BLOOD LEFT ARM  Result Value Ref Range Status   Specimen Description   Final    BLOOD LEFT ARM Performed at [facility], [place] [address] [place], [place] [address]    Special Requests   Final    BOTTLES DRAWN AEROBIC ONLY Blood Culture adequate volume Performed at [facility], [address], [place], [place] [address]    Culture   Final    NO GROWTH 5 DAYS Performed at [facility], [place] [address], [place], [place] [address]  Report Status 10/05/2019 FINAL  Final  MRSA PCR Screening     Status: None   Collection Time: 10/01/19 10:32 AM   Specimen: Nasal Mucosa; Nasopharyngeal  Result Value Ref Range Status   MRSA by PCR NEGATIVE NEGATIVE Final    Comment:        The GeneXpert MRSA Assay (FDA approved for NASAL specimens only), is one component of a comprehensive MRSA colonization surveillance program. It is not intended to diagnose MRSA infection nor to guide or monitor treatment for MRSA infections. Performed at [facility], [place] [address] [place], [place] [address]   Culture, blood (routine x 2)     Status: None (Preliminary result)   Collection Time: 10/03/19  6:13 AM   Specimen: BLOOD  Result Value Ref Range Status   Specimen Description BLOOD LEFT ANTECUBITAL  Final   Special Requests AEROBIC BOTTLE ONLY Blood Culture adequate volume  Final   Culture   Final    NO GROWTH 2 DAYS Performed at [facility], [place] [address] [place], [place] [address]    Report Status PENDING  Incomplete  Culture, blood (routine x 2)     Status: None (Preliminary result)   Collection Time: 10/03/19  6:14 AM   Specimen: BLOOD LEFT HAND  Result Value Ref Range Status   Specimen Description   Final    BLOOD LEFT HAND Performed at [facility], [place] [address] [place], [place] [address]     Special Requests   Final    NONE Performed at [facility], [place] [address] [place], [place] [address]    Culture   Final    NO GROWTH 2 DAYS Performed at [place] [address], [place], [place] [address]    Report Status PENDING  Incomplete     Time coordinating discharge: 45 minutes  SIGNED:   Tawni Millers, MD  [facility] 10/06/2019, 7:53 AM

## 2019-10-06 NOTE — Progress Notes (Signed)
Patient unsteady ambulating to the commode, needed two person assist. MD spoke to pt and stated that she will be DC to SNF after PICC placement.

## 2019-10-06 NOTE — Progress Notes (Signed)
Peripherally Inserted Central Catheter/Midline Placement  The IV Nurse has discussed with the patient and/or persons authorized to consent for the patient, the purpose of this procedure and the potential benefits and risks involved with this procedure.  The benefits include less needle sticks, lab draws from the catheter, and the patient may be discharged home with the catheter. Risks include, but not limited to, infection, bleeding, blood clot (thrombus formation), and puncture of an artery; nerve damage and irregular heartbeat and possibility to perform a PICC exchange if needed/ordered by physician.  Alternatives to this procedure were also discussed.  Bard Power PICC patient education guide, fact sheet on infection prevention and patient information card has been provided to patient /or left at bedside.    PICC/Midline Placement Documentation  PICC Single Lumen [id number] PICC Left Basilic 39 cm 1 cm (Active)  Indication for Insertion or Continuance of Line Home intravenous therapies (PICC only) 10/06/19 1500  Exposed Catheter (cm) 1 cm 10/06/19 1500  Site Assessment Clean;Dry;Intact 10/06/19 1500  Line Status Flushed;Saline locked;Blood return noted 10/06/19 1500  Dressing Type Transparent;Securing device 10/06/19 1500  Dressing Status Clean;Dry;Intact;Antimicrobial disc in place 10/06/19 1500  Line Care Connections checked and tightened 10/06/19 1500  Dressing Intervention New dressing;Other (Comment) 10/06/19 1500  Dressing Change Due 10/13/19 10/06/19 1500    Patient gave written consent   Monique King 10/06/2019, 3:16 PM

## 2019-10-06 NOTE — Plan of Care (Signed)
Remains on RA, sats dip at times to 88 to 90% with sleep.  While awake, pt sats 91-94%.

## 2019-10-06 NOTE — TOC Transition Note (Signed)
Transition of Care [facility]) - CM/SW Discharge Note   Patient Details  Name: Monique King MRN: [id number] Date of Birth: 04/25/1933  Transition of Care [facility]) CM/SW Contact:  Ninfa Meeker, RN Phone Number: [phone] (working remotely) 10/06/2019, 2:37 PM   Clinical Narrative:   Patient will DC to: [facility] date: 10/06/19 Family notified: Daughter, [name] [phone] Transport by: [name] : 4:00pm   Per MD patient is medically ready for discharge to [facility], Bedside RN, patient and her daughter have been notified. Winona Legato at [facility] states patient will go to room 801,Bedside RN  to report to [phone]. Discharge Summary, FL2 sent via the HUB to [facility]. Ambulance transport requested.      Final next level of care: Skilled Nursing Facility Barriers to Discharge: No Barriers Identified   Patient Goals and CMS Choice Patient states their goals for this hospitalization and ongoing recovery are:: Daughter wants her mom to recover and return to her ALF CMS Medicare.gov Compare Post Acute Care list provided to:: Patient Represenative (must comment)(daughter)    Discharge Placement                       Discharge Plan and Services   Discharge Planning Services: CM Consult Post Acute Care Choice: [facility]                               Social Determinants of Health (SDOH) Interventions     Readmission Risk Interventions No flowsheet data found.

## 2019-10-06 NOTE — Progress Notes (Signed)
PICC lines was placed at 1500. [facility] and gave report to [employer]. Waiting for transportation.

## 2019-10-08 LAB — CULTURE, BLOOD (ROUTINE X 2)
Culture: NO GROWTH
Culture: NO GROWTH
Special Requests: ADEQUATE

## 2019-10-10 ENCOUNTER — Telehealth: Payer: Self-pay | Admitting: *Deleted

## 2019-10-10 NOTE — Telephone Encounter (Signed)
RN called the listed number, reached her assisted living center. They stated Monique King was not there, but could not advise where she actually was at this time.  They provided a phone number for her daughter, Monique King [phone]).  Per Monique King, patient was discharged to [facility] [phone] for the duration of antibiotics through her PICC.    RN left message for nurse/social worker at [facility] asking for phone number to call the nurse and patient for her scheduled visit 12/9 at 2:45. Landis Gandy, RN      Tommy Medal, Lavell Islam, MD  [facility] Triage Nurse Pool        I need to get a good phone number for her for an EVISIT for followup. She has severe COVID and MSSAB

## 2019-10-10 NOTE — Telephone Encounter (Signed)
Thanks Michelle

## 2019-10-11 NOTE — Telephone Encounter (Signed)
RN left another message for social worker asking for number that we can call on 12/9 at 2:45 to speak with the patient and her nurse for hospital follow up appointment. Landis Gandy, RN

## 2019-10-12 NOTE — Telephone Encounter (Signed)
Spoke with Lonn Georgia, Surveyor, quantity of Nursing at [facility], as I have been unable to confirm any contact information for patient's upcoming appointment.  Kayla requested a call on her personal cell for the appointment 12/9 at 2:45.  RN confirmed our phone number, that Dr Tommy Medal would need to be able to speak with the patient and the nurse taking care of her at this appointment.   Kayla's personal cell number is [phone]. Landis Gandy, RN

## 2019-10-24 ENCOUNTER — Telehealth: Payer: Self-pay

## 2019-10-24 NOTE — Telephone Encounter (Signed)
Patient's daughter is calling to ensure office visit this week is an e visit and wonders if it might be possible to end IV antibiotics early if she is doing well.  Advised to discuss this with the provider at the visit.   Laverle Patter, RN

## 2019-10-26 ENCOUNTER — Ambulatory Visit (INDEPENDENT_AMBULATORY_CARE_PROVIDER_SITE_OTHER): Payer: Medicare Other | Admitting: Infectious Disease

## 2019-10-26 ENCOUNTER — Encounter: Payer: Self-pay | Admitting: Infectious Disease

## 2019-10-26 ENCOUNTER — Other Ambulatory Visit: Payer: Self-pay

## 2019-10-26 DIAGNOSIS — R7881 Bacteremia: Secondary | ICD-10-CM

## 2019-10-26 DIAGNOSIS — U071 COVID-19: Secondary | ICD-10-CM | POA: Diagnosis not present

## 2019-10-26 DIAGNOSIS — B9561 Methicillin susceptible Staphylococcus aureus infection as the cause of diseases classified elsewhere: Secondary | ICD-10-CM

## 2019-10-26 DIAGNOSIS — J9601 Acute respiratory failure with hypoxia: Secondary | ICD-10-CM

## 2019-10-26 DIAGNOSIS — R011 Cardiac murmur, unspecified: Secondary | ICD-10-CM

## 2019-10-26 DIAGNOSIS — L03113 Cellulitis of right upper limb: Secondary | ICD-10-CM

## 2019-10-26 NOTE — Progress Notes (Signed)
Virtual Visit via Telephone Note  I connected with Monique King on 10/26/19 at  2:45 PM EST by telephone and verified that I am speaking with the correct person using two identifiers.  Location: Patient: SNF Provider: [facility]   I discussed the limitations, risks, security and privacy concerns of performing an evaluation and management service by telephone and the availability of in person appointments. I also discussed with the patient that there may be a patient responsible charge related to this service. The patient expressed understanding and agreed to proceed.   History of Present Illness:   Zaeleigh Bok is an 85 year old lady with hx of breast cancer sp surgery and axillary LN dissection decades ago with chronic lymphedema in R arm and hospitalization for this roughly 20 years ago who was admitted to [facility] with COVID 19 PNA. She received IV steroids and remdesivir.   Her course was complicated by MSSA bacteremia due to cellulitis from RUE. She was narrowed to cefazolin and repeat blood cultures negative. TTE did not show vegetations. She was DC to SNF with plans to complete 4 weeks of IV abx for "complicated MSSAB."   Observations/Objective:  Patient seems to have recovered from [facility] 19 and SAB and is eager to return to other part of the facility the ALF   Assessment and Plan:  MSSAB:  DC IV antibiotics a few days early and DC PICC  Give keflex 500mg  qid to complete a total of 6 weeks of antibiotics  Check surveillance cultures when I see her in January > 2 weeks after stopping IV abx  Lympedema and recurrent cellulitis: not that many episodes but prudent to have keflex on hand in future in case she has another acute flare  COVID 19 seems to be nearly fully recovered  Follow Up Instructions:    I discussed the assessment and treatment plan with the patient. The patient was provided an opportunity to ask questions and all were answered. The patient agreed with the plan  and demonstrated an understanding of the instructions.   The patient was advised to call back or seek an in-person evaluation if the symptoms worsen or if the condition fails to improve as anticipated.  I provided 25 minutes of non-face-to-face time during this encounter.   Alcide Evener, MD

## 2019-12-06 ENCOUNTER — Ambulatory Visit: Payer: Medicare Other | Admitting: Infectious Disease

## 2019-12-06 ENCOUNTER — Telehealth: Payer: Self-pay

## 2019-12-06 NOTE — Telephone Encounter (Signed)
COVID-19 Pre-Screening Questions:12/06/19 Do you currently have a fever (>100 F), chills or unexplained body aches? NO   Are you currently experiencing new cough, shortness of breath, sore throat, runny nose?NO .  Have you recently travelled outside the state of [place] in the last 14 days? NO .  Have you been in contact with someone that is currently pending confirmation of Covid19 testing or has been confirmed to have the Covid19 virus? NO  **If the patient answers NO to ALL questions -  advise the patient to please call the clinic before coming to the office should any symptoms develop.     

## 2019-12-07 ENCOUNTER — Other Ambulatory Visit: Payer: Self-pay

## 2019-12-07 ENCOUNTER — Encounter: Payer: Self-pay | Admitting: Infectious Disease

## 2019-12-07 ENCOUNTER — Ambulatory Visit (INDEPENDENT_AMBULATORY_CARE_PROVIDER_SITE_OTHER): Payer: Medicare Other | Admitting: Infectious Disease

## 2019-12-07 VITALS — BP 108/69 | HR 99 | Temp 97.4°F

## 2019-12-07 DIAGNOSIS — I890 Lymphedema, not elsewhere classified: Secondary | ICD-10-CM | POA: Diagnosis not present

## 2019-12-07 DIAGNOSIS — J9601 Acute respiratory failure with hypoxia: Secondary | ICD-10-CM | POA: Diagnosis not present

## 2019-12-07 DIAGNOSIS — U071 COVID-19: Secondary | ICD-10-CM

## 2019-12-07 DIAGNOSIS — B9561 Methicillin susceptible Staphylococcus aureus infection as the cause of diseases classified elsewhere: Secondary | ICD-10-CM

## 2019-12-07 DIAGNOSIS — R7881 Bacteremia: Secondary | ICD-10-CM | POA: Diagnosis present

## 2019-12-07 DIAGNOSIS — L03113 Cellulitis of right upper limb: Secondary | ICD-10-CM | POA: Diagnosis not present

## 2019-12-07 MED ORDER — CEPHALEXIN 500 MG PO CAPS: 500.0000 mg | ORAL_CAPSULE | Freq: Four times a day (QID) | ORAL | 5 refills | Status: AC

## 2019-12-07 NOTE — Progress Notes (Signed)
Subjective:   Chief complaint: Follow-up for Staph aureus bacteremia and COVID-19 pneumonia   Patient ID: Monique King, female    DOB: [birth year], 86 y.o.   MRN: [id number]  HPI year old lady with hx of breast cancer sp surgery and axillary LN dissection decades ago with chronic lymphedema in R arm and hospitalization for this roughly 20 years ago who was admitted to [facility] with COVID 19 PNA. She received IV steroids and remdesivir.   Her course was complicated by MSSA bacteremia due to cellulitis from RUE. She was narrowed to cefazolin and repeat blood cultures negative. TTE did not show vegetations. She was DC to SNF with plans to complete 4 weeks of IV abx for "complicated MSSAB.  I connected with her virtually on 9 December and we finished her antibiotics little bit earlier and got rid of her PICC line.  Also extended her antibiotics with [place] an additional Keflex to bring her to 6 weeks of total antibiotics.  She is off antibiotics now.  She continues to have chronic lymphedema changes.  In talking to her and her daughter who accompanied her to clinic today the last episode of cellulitis she had was decades ago.  That being said I do think there is a risk for recurrence given her recent infection.  She is completely recovered from her COVID-19 pneumonia and has had her first dose of COVID-19 vaccine as well.    Past Medical History:  Diagnosis Date  . Breast cancer [facility])     Past Surgical History:  Procedure Laterality Date  . ABDOMINAL HYSTERECTOMY    . MASTECTOMY Right     No family history on file.    Social History   Socioeconomic History  . Marital status: Widowed    Spouse name: Not on file  . Number of children: Not on file  . Years of education: Not on file  . Highest education level: Not on file  Occupational History  . Not on file  Tobacco Use  . Smoking status: Never Smoker  . Smokeless tobacco: Never Used  Substance and Sexual Activity  . Alcohol  use: Never  . Drug use: Not on file  . Sexual activity: Not on file  Other Topics Concern  . Not on file  Social History Narrative  . Not on file   Social Determinants of Health   Financial Resource Strain:   . Difficulty of Paying Living Expenses: Not on file  Food Insecurity:   . Worried About Charity fundraiser in the Last Year: Not on file  . Ran Out of Food in the Last Year: Not on file  Transportation Needs:   . Lack of Transportation (Medical): Not on file  . Lack of Transportation (Non-Medical): Not on file  Physical Activity:   . Days of Exercise per Week: Not on file  . Minutes of Exercise per Session: Not on file  Stress:   . Feeling of Stress : Not on file  Social Connections:   . Frequency of Communication with Friends and Family: Not on file  . Frequency of Social Gatherings with Friends and Family: Not on file  . Attends Religious Services: Not on file  . Active Member of Clubs or Organizations: Not on file  . Attends Archivist Meetings: Not on file  . Marital Status: Not on file    No Known Allergies   Current Outpatient Medications:  .  acetaminophen (TYLENOL) 325 MG tablet, Take 325 mg by mouth  2 (two) times daily. , Disp: , Rfl:  .  diazepam (VALIUM) 2 MG tablet, Take 2.5 tablets (5 mg total) by mouth at bedtime as needed for anxiety., Disp: 5 tablet, Rfl: 0 .  docusate sodium (COLACE) 100 MG capsule, Take 100 mg by mouth daily., Disp: , Rfl:  .  fentaNYL (DURAGESIC) 25 MCG/HR, Place 1 patch onto the skin every 3 (three) days., Disp: 1 patch, Rfl: 0 .  ferrous sulfate 325 (65 FE) MG EC tablet, Take 325 mg by mouth See admin instructions. Every Monday, Wednesday, and Friday., Disp: , Rfl:  .  furosemide (LASIX) 20 MG tablet, Take 1 tablet (20 mg total) by mouth daily as needed for fluid (dyspnea.)., Disp: 10 tablet, Rfl: 0 .  glipiZIDE (GLUCOTROL) 5 MG tablet, Take 2.5 mg by mouth daily before breakfast., Disp: , Rfl:  .  Infant Care Products  (DERMACLOUD) CREA, Apply 1 application topically 2 (two) times daily., Disp: , Rfl:  .  ketoconazole (NIZORAL) 2 % cream, Apply 1 application topically at bedtime. Left breast and bottom., Disp: , Rfl:  .  mometasone (ELOCON) 0.1 % ointment, Apply 1 application topically 2 (two) times daily. To affected areas on the body and ears., Disp: , Rfl:  .  selenium sulfide (SELSUN) 2.5 % shampoo, Apply 1 application topically every other day., Disp: , Rfl:  .  simvastatin (ZOCOR) 10 MG tablet, Take 10 mg by mouth daily., Disp: , Rfl:  .  traZODone (DESYREL) 50 MG tablet, Take 50 mg by mouth at bedtime., Disp: , Rfl:  .  triamcinolone (KENALOG) 0.025 % cream, Apply 1 application topically daily as needed. To left breast., Disp: , Rfl:   Review of Systems  Constitutional: Negative for activity change, appetite change, chills, diaphoresis, fatigue, fever and unexpected weight change.  HENT: Negative for congestion, rhinorrhea, sinus pressure, sneezing, sore throat and trouble swallowing.   Eyes: Negative for photophobia and visual disturbance.  Respiratory: Negative for cough, chest tightness, shortness of breath, wheezing and stridor.   Cardiovascular: Negative for chest pain, palpitations and leg swelling.  Gastrointestinal: Negative for abdominal distention, abdominal pain, anal bleeding, blood in stool, constipation, diarrhea, nausea and vomiting.  Genitourinary: Negative for difficulty urinating, dysuria, flank pain and hematuria.  Musculoskeletal: Negative for arthralgias, back pain, gait problem, joint swelling and myalgias.  Skin: Positive for color change. Negative for pallor, rash and wound.  Neurological: Negative for dizziness, tremors, weakness and light-headedness.  Hematological: Negative for adenopathy. Does not bruise/bleed easily.  Psychiatric/Behavioral: Negative for agitation, behavioral problems, confusion, decreased concentration, dysphoric mood and sleep disturbance.         Objective:   Physical Exam Constitutional:      General: She is not in acute distress.    Appearance: She is well-developed. She is not diaphoretic.  HENT:     Head: Normocephalic and atraumatic.     Mouth/Throat:     Pharynx: No oropharyngeal exudate.  Eyes:     General: No scleral icterus.    Conjunctiva/sclera: Conjunctivae normal.  Cardiovascular:     Rate and Rhythm: Normal rate and regular rhythm.  Pulmonary:     Effort: Pulmonary effort is normal. No respiratory distress.     Breath sounds: No wheezing.  Abdominal:     General: There is no distension.  Musculoskeletal:        General: No tenderness.     Cervical back: Normal range of motion and neck supple.  Skin:    General: Skin is warm  and dry.     Coloration: Skin is not pale.     Findings: No erythema or rash.  Neurological:     General: No focal deficit present.     Mental Status: She is alert and oriented to person, place, and time.     Motor: No abnormal muscle tone.     Coordination: Coordination normal.  Psychiatric:        Mood and Affect: Mood normal.        Behavior: Behavior normal.        Thought Content: Thought content normal.        Judgment: Judgment normal.   She has marked lymphedema of her right upper extremity where she had had axillary and lymph node dissection       Assessment & Plan:   Methicillin sensitive staph coccus aureus bacteremia: Status post nearly 4 weeks of cefazolin plus an additional 2 weeks of Keflex for total of 6 weeks of therapy.  We will check surveillance blood cultures today.  Right lymphedema and history of recurrent cellulitis: I have sent in a prescription via paper prescription for her nursing facility so she can have Keflex on hand should she have the first signs of cellulitis and could initiate Keflex for signs of redness or other symptoms to suggest infection there.  COVID-19 pneumonia as she is recovered fully and is tolerated her first dose of  vaccine.

## 2019-12-18 LAB — CULTURE, BLOOD (SINGLE)
MICRO NUMBER:: 10076414
MICRO NUMBER:: 10076415

## 2020-08-05 ENCOUNTER — Emergency Department (HOSPITAL_COMMUNITY)
Admission: EM | Admit: 2020-08-05 | Discharge: 2020-08-05 | Disposition: A | Discharge status: Home / Self Care | Payer: Medicare Other | Attending: Emergency Medicine | Admitting: Emergency Medicine

## 2020-08-05 DIAGNOSIS — R55 Syncope and collapse: Secondary | ICD-10-CM | POA: Diagnosis not present

## 2020-08-05 DIAGNOSIS — R531 Weakness: Secondary | ICD-10-CM | POA: Diagnosis present

## 2020-08-05 DIAGNOSIS — R4182 Altered mental status, unspecified: Secondary | ICD-10-CM | POA: Diagnosis not present

## 2020-08-05 DIAGNOSIS — Z8616 Personal history of COVID-19: Secondary | ICD-10-CM | POA: Diagnosis not present

## 2020-08-05 DIAGNOSIS — Z853 Personal history of malignant neoplasm of breast: Secondary | ICD-10-CM | POA: Diagnosis not present

## 2020-08-05 DIAGNOSIS — I890 Lymphedema, not elsewhere classified: Secondary | ICD-10-CM | POA: Diagnosis not present

## 2020-08-05 NOTE — ED Triage Notes (Signed)
Per ems pt coming from assisted living facility. Pt was eating dinner tonight around 5, when she had sudden onset of diaphoresis/cyanosis/ and just not feeling like herself. Pt denies any pain. Pt 88 on RA. 97% on 4L. [place]. Pt is alert an oriented x4.

## 2020-08-05 NOTE — ED Provider Notes (Signed)
[place] EMERGENCY DEPARTMENT Provider Note   CSN: [id number] Arrival date & time: 08/05/20  1837     History Chief Complaint  Patient presents with  . Weakness    Monique King is a 86 y.o. female.  HPI The patient was at her assisted living facility, sitting at the dining table, when it was noticed that she appeared pale, and had sweating with purple color lips.  Patient is unable to give history.  Patient's daughter is with her and states that she talk to caregivers at the facility who described the incident to her.  There have been no recent illnesses.  Patient's daughter states that the patient is at her baseline except "she appears pale."  According to the daughter, the patient has a chronic lymphedema state of her right arm, and cannot use it at all.  Also she is bedbound, and gets around in a wheelchair.  Level 5 caveat-altered mental status    Past Medical History:  Diagnosis Date  . Breast cancer [facility])     Patient Active Problem List   Diagnosis Date Noted  . Cellulitis of right upper extremity 10/03/2019  . Bacteremia due to Staphylococcus aureus 10/02/2019  . Fever 10/01/2019  . Thrombocytopenia ([place]) 10/01/2019  . COVID-19 virus infection 10/01/2019  . Lab test positive for detection of COVID-19 virus 10/01/2019  . Acute respiratory failure with hypoxia ([place]) 09/30/2019  . Cardiac murmur 09/30/2019    Past Surgical History:  Procedure Laterality Date  . ABDOMINAL HYSTERECTOMY    . MASTECTOMY Right      OB History   No obstetric history on file.     No family history on file.  Social History   Tobacco Use  . Smoking status: Never Smoker  . Smokeless tobacco: Never Used  Substance Use Topics  . Alcohol use: Never  . Drug use: Not on file    Home Medications Prior to Admission medications   Medication Sig Start Date End Date Taking? Authorizing Provider  acetaminophen (TYLENOL) 325 MG tablet Take 325 mg by mouth 2 (two)  times daily.     [provider]  diazepam (VALIUM) 2 MG tablet Take 2.5 tablets (5 mg total) by mouth at bedtime as needed for anxiety. 10/06/19   Arrien, Jimmy Picket, MD  docusate sodium (COLACE) 100 MG capsule Take 100 mg by mouth daily.    [provider]  fentaNYL (DURAGESIC) 25 MCG/HR Place 1 patch onto the skin every 3 (three) days. 10/07/19   Arrien, Jimmy Picket, MD  ferrous sulfate 325 (65 FE) MG EC tablet Take 325 mg by mouth See admin instructions. Every Monday, Wednesday, and Friday.    [provider]  furosemide (LASIX) 20 MG tablet Take 1 tablet (20 mg total) by mouth daily as needed for fluid (dyspnea.). 10/06/19   Arrien, Jimmy Picket, MD  glipiZIDE (GLUCOTROL) 5 MG tablet Take 2.5 mg by mouth daily before breakfast.    [provider]  Infant Care Products (DERMACLOUD) CREA Apply 1 application topically 2 (two) times daily.    [provider]  ketoconazole (NIZORAL) 2 % cream Apply 1 application topically at bedtime. Left breast and bottom.    [provider]  mometasone (ELOCON) 0.1 % ointment Apply 1 application topically 2 (two) times daily. To affected areas on the body and ears.    [provider]  selenium sulfide (SELSUN) 2.5 % shampoo Apply 1 application topically every other day.    [provider]  simvastatin (ZOCOR) 10 MG tablet Take 10 mg by mouth daily.    [provider]  traZODone (DESYREL) 50 MG tablet Take 50 mg by mouth at bedtime.    [provider]  triamcinolone (KENALOG) 0.025 % cream Apply 1 application topically daily as needed. To left breast.    [provider]    Allergies    Patient has no known allergies.  Review of Systems   Review of Systems  All other systems reviewed and are negative.   Physical Exam Updated Vital Signs BP 127/72   Pulse 86   Temp 99.4 F (37.4 C) (Rectal)   Resp (!) 22   SpO2 97%   Physical Exam Vitals and  nursing note reviewed.  Constitutional:      Appearance: She is well-developed.  HENT:     Head: Normocephalic and atraumatic.     Right Ear: External ear normal.     Left Ear: External ear normal.  Eyes:     Conjunctiva/sclera: Conjunctivae normal.     Pupils: Pupils are equal, round, and reactive to light.  Neck:     Trachea: Phonation normal.  Cardiovascular:     Rate and Rhythm: Normal rate and regular rhythm.     Heart sounds: Normal heart sounds.  Pulmonary:     Effort: Pulmonary effort is normal.     Breath sounds: Normal breath sounds.  Abdominal:     Palpations: Abdomen is soft.     Tenderness: There is no abdominal tenderness.  Musculoskeletal:        General: Normal range of motion.     Cervical back: Normal range of motion and neck supple.     Comments: She is able to sit up on the bed and independently move her left arm, both legs.  Right arm, his lip, and is wrapped in a compression device, from the shoulder to the fingertips.  Skin:    General: Skin is warm and dry.  Neurological:     Mental Status: She is alert and oriented to person, place, and time.     Cranial Nerves: No cranial nerve deficit.     Sensory: No sensory deficit.     Motor: No abnormal muscle tone.     Coordination: Coordination normal.  Psychiatric:        Mood and Affect: Mood normal.        Behavior: Behavior normal.     ED Results / Procedures / Treatments   Labs (all labs ordered are listed, but only abnormal results are displayed) Labs Reviewed - No data to display  EKG None  Radiology No results found.  Procedures Procedures (including critical care time)  Medications Ordered in ED Medications - No data to display  ED Course  I have reviewed the triage vital signs and the nursing notes.  Pertinent labs & imaging results that were available during my care of the patient were reviewed by me and considered in my medical decision making (see chart for details).    MDM  Rules/Calculators/A&P                           Patient Vitals for the past 24 hrs:  BP Temp Temp src Pulse Resp SpO2  08/05/20 1934 -- -- -- -- -- 97 %  08/05/20 1934 -- -- -- 86 (!) 22 97 %  08/05/20 1926 -- 99.4 F (37.4 C) Rectal -- -- --  08/05/20 1915 127/72 -- --  88 18 97 %  08/05/20 1900 120/65 -- -- 89 20 98 %  08/05/20 1856 -- -- -- 89 20 98 %  08/05/20 1854 -- -- -- 89 (!) 29 98 %  08/05/20 1850 122/63 (!) 97.5 F (36.4 C) Oral 91 17 98 %  08/05/20 1843 -- -- -- -- -- 97 %    8:07 PM Reevaluation with update and discussion. After initial assessment and treatment, an updated evaluation reveals she is comfortable has no further complaints.  Discussed orthostatic vital signs and rectal temperature with daughter, daughter continues to assert that the patient appears to be at her baseline, communicating well and acting normal. Daleen Bo   Medical Decision Making:  This patient is presenting for evaluation of transient symptoms consistent with vasovagal episode, which does not require a range of treatment options, and is not a complaint that involves a high risk of morbidity and mortality. The differential diagnoses include blood pressure, acute illness, vasovagal episode. I decided to review old records, and in summary patient living in assisted living facility, with transient symptoms, not recurred, and has reassuring vital signs and temperature.  Findings discussed with the patient's daughter, aggressive intervention not desired by daughter, and not expected by daughter..  I obtained additional historical information from daughter at the bedside.    Critical Interventions-clinical evaluation, discussion with daughter  After These Interventions, the Patient was reevaluated and was found stable for discharge  CRITICAL CARE-no Performed by: Daleen Bo  Nursing Notes Reviewed/ Care Coordinated Applicable Imaging Reviewed Interpretation of Laboratory Data incorporated  into ED treatment  The patient appears reasonably screened and/or stabilized for discharge and I doubt any other medical condition or other [facility] requiring further screening, evaluation, or treatment in the ED at this time prior to discharge.  Plan: Home Medications-continue usual; Home Treatments-rest, fluids; return here if the recommended treatment, does not improve the symptoms; Recommended follow up-PCP, as needed     Final Clinical Impression(s) / ED Diagnoses Final diagnoses:  Vasovagal episode    Rx / DC Orders ED Discharge Orders    None       Daleen Bo, MD 08/05/20 2007

## 2020-08-05 NOTE — ED Notes (Signed)
Called [facility] for transport.  

## 2020-08-05 NOTE — Discharge Instructions (Addendum)
Make sure you are eating and drinking regularly.  Follow-up with your doctor as needed for problems.

## 2020-12-30 IMAGING — DX DG CHEST 1V PORT
1 series · 1 of 1 positions shown · non-contrast
Comparison: None.

CLINICAL DATA: Cough and shortness of breath

EXAM:
PORTABLE CHEST 1 VIEW

[chest ap]
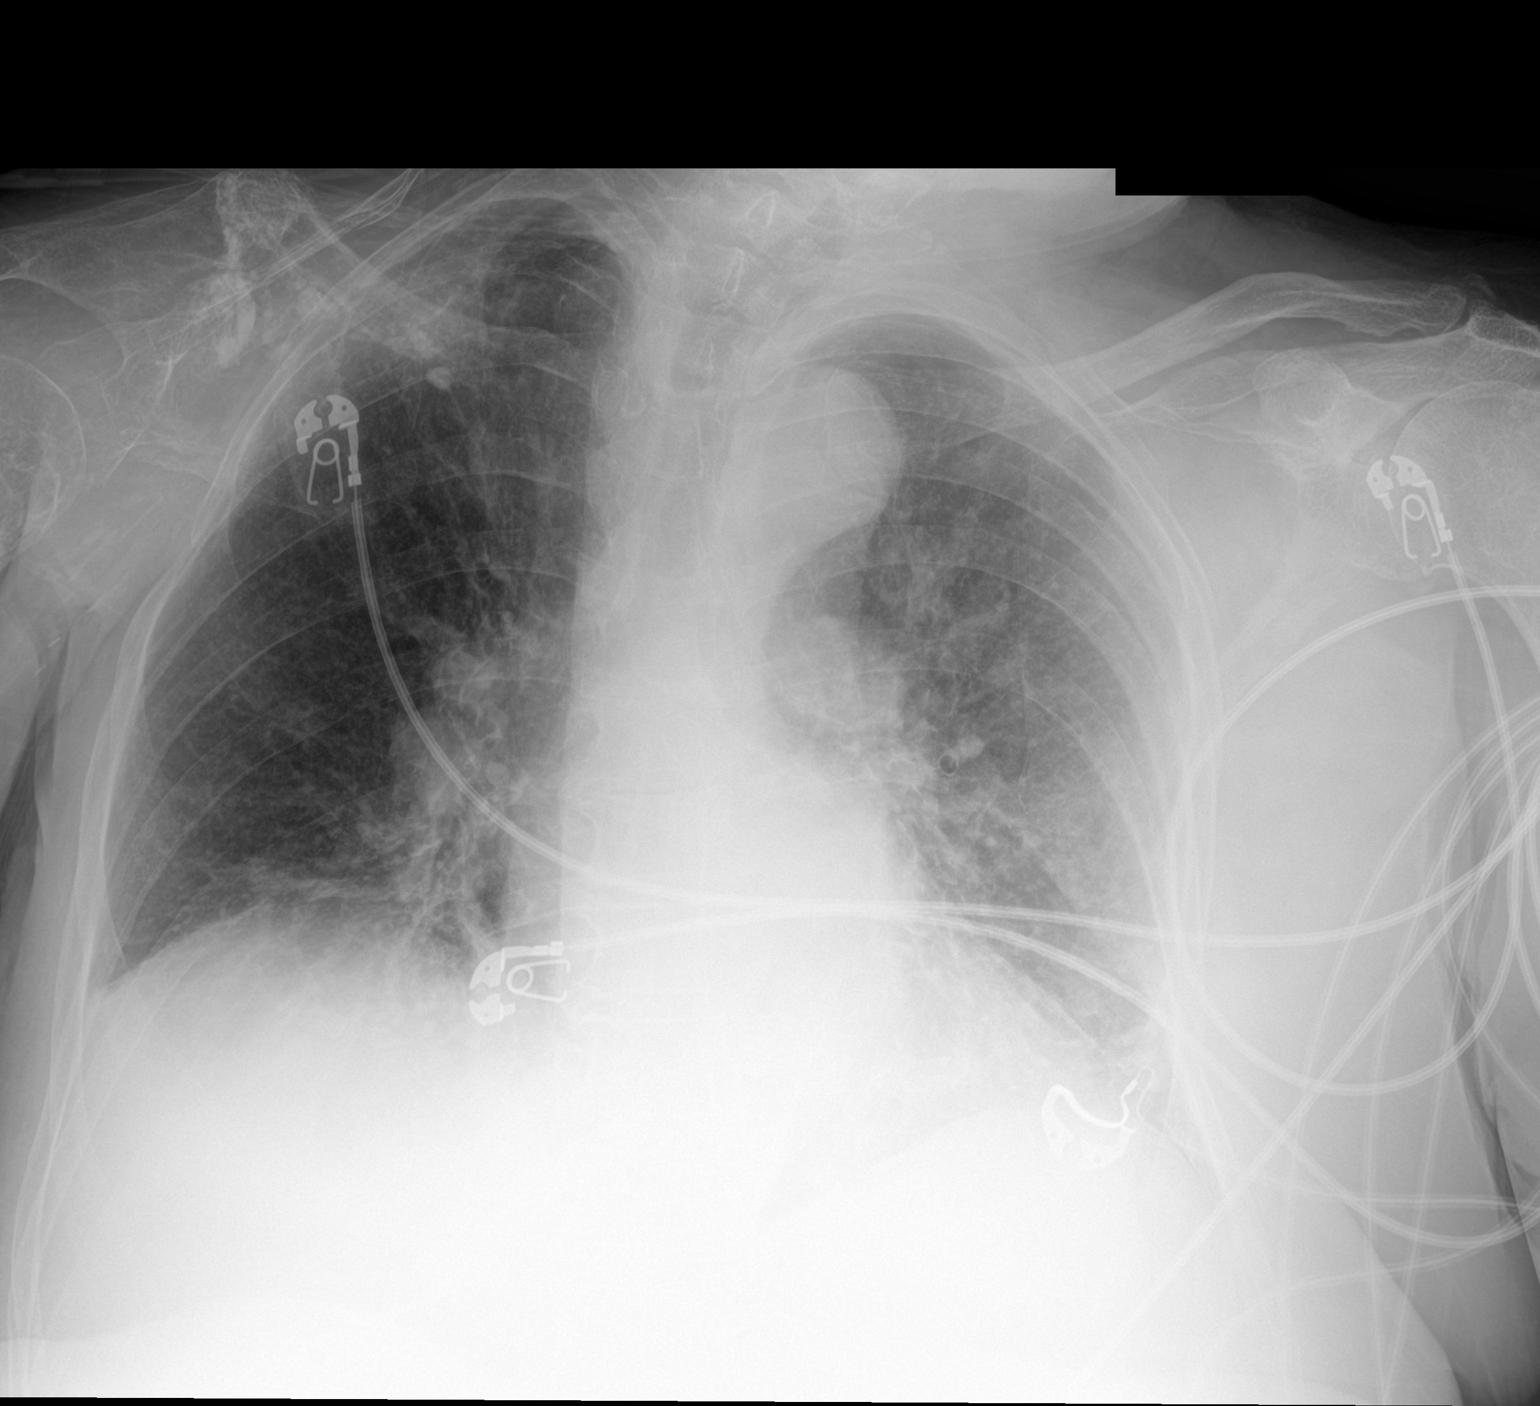

[1 of 1 positions shown; findings below may reference images not displayed]

FINDINGS: Cardiac shadows within normal limits. Tortuous thoracic aorta is
noted. Lungs are hypoinflated with mild crowding of the vascular
markings. No focal confluent infiltrate is seen. Prior clavicular
fracture is noted with some dystrophic calcification. No acute bony
abnormality is noted.
IMPRESSION: No acute abnormality noted.

## 2021-02-10 ENCOUNTER — Encounter (HOSPITAL_COMMUNITY): Payer: Self-pay

## 2021-02-10 ENCOUNTER — Emergency Department (HOSPITAL_COMMUNITY): Payer: Medicare Other

## 2021-02-10 ENCOUNTER — Other Ambulatory Visit: Payer: Self-pay

## 2021-02-10 ENCOUNTER — Emergency Department (HOSPITAL_COMMUNITY)
Admission: EM | Admit: 2021-02-10 | Discharge: 2021-02-10 | Disposition: A | Discharge status: Home / Self Care | Payer: Medicare Other | Attending: Emergency Medicine | Admitting: Emergency Medicine

## 2021-02-10 DIAGNOSIS — N3000 Acute cystitis without hematuria: Secondary | ICD-10-CM | POA: Diagnosis not present

## 2021-02-10 DIAGNOSIS — F0390 Unspecified dementia without behavioral disturbance: Secondary | ICD-10-CM | POA: Insufficient documentation

## 2021-02-10 DIAGNOSIS — R600 Localized edema: Secondary | ICD-10-CM | POA: Diagnosis present

## 2021-02-10 DIAGNOSIS — Z8616 Personal history of COVID-19: Secondary | ICD-10-CM | POA: Diagnosis not present

## 2021-02-10 DIAGNOSIS — Z7982 Long term (current) use of aspirin: Secondary | ICD-10-CM | POA: Insufficient documentation

## 2021-02-10 DIAGNOSIS — Z853 Personal history of malignant neoplasm of breast: Secondary | ICD-10-CM | POA: Insufficient documentation

## 2021-02-10 DIAGNOSIS — T148XXA Other injury of unspecified body region, initial encounter: Secondary | ICD-10-CM

## 2021-02-10 DIAGNOSIS — M7981 Nontraumatic hematoma of soft tissue: Secondary | ICD-10-CM | POA: Diagnosis not present

## 2021-02-10 LAB — CBC WITH DIFFERENTIAL/PLATELET
Abs Immature Granulocytes: 0.09 10*3/uL — ABNORMAL HIGH (ref 0.00–0.07)
Basophils Absolute: 0 10*3/uL (ref 0.0–0.1)
Basophils Relative: 0 %
Eosinophils Absolute: 0.1 10*3/uL (ref 0.0–0.5)
Eosinophils Relative: 1 %
HCT: 36.3 % (ref 36.0–46.0)
Hemoglobin: 12.1 g/dL (ref 12.0–15.0)
Immature Granulocytes: 1 %
Lymphocytes Relative: 9 %
Lymphs Abs: 0.7 10*3/uL (ref 0.7–4.0)
MCH: 32.7 pg (ref 26.0–34.0)
MCHC: 33.3 g/dL (ref 30.0–36.0)
MCV: 98.1 fL (ref 80.0–100.0)
Monocytes Absolute: 0.5 10*3/uL (ref 0.1–1.0)
Monocytes Relative: 6 %
Neutro Abs: 6.4 10*3/uL (ref 1.7–7.7)
Neutrophils Relative %: 83 %
Platelets: 181 10*3/uL (ref 150–400)
RBC: 3.7 MIL/uL — ABNORMAL LOW (ref 3.87–5.11)
RDW: 12.9 % (ref 11.5–15.5)
WBC: 7.8 10*3/uL (ref 4.0–10.5)
nRBC: 0 % (ref 0.0–0.2)

## 2021-02-10 LAB — URINALYSIS, ROUTINE W REFLEX MICROSCOPIC
Bilirubin Urine: NEGATIVE
Glucose, UA: 50 mg/dL — AB
Ketones, ur: NEGATIVE mg/dL
Nitrite: POSITIVE — AB
Protein, ur: NEGATIVE mg/dL
Specific Gravity, Urine: 1.023 (ref 1.005–1.030)
pH: 5 (ref 5.0–8.0)

## 2021-02-10 LAB — BASIC METABOLIC PANEL
Anion gap: 10 (ref 5–15)
BUN: 24 mg/dL — ABNORMAL HIGH (ref 8–23)
CO2: 23 mmol/L (ref 22–32)
Calcium: 8.9 mg/dL (ref 8.9–10.3)
Chloride: 106 mmol/L (ref 98–111)
Creatinine, Ser: 0.79 mg/dL (ref 0.44–1.00)
GFR, Estimated: >60 mL/min (ref >60)
Glucose, Bld: 203 mg/dL — ABNORMAL HIGH (ref 70–99)
Potassium: 4.4 mmol/L (ref 3.5–5.1)
Sodium: 139 mmol/L (ref 135–145)

## 2021-02-10 MED ORDER — LIDOCAINE-EPINEPHRINE (PF) 2 %-1:200000 IJ SOLN
10.0000 mL | Freq: Once | INTRAMUSCULAR | Status: AC
  Administered 2021-02-10: 10 mL via INTRADERMAL
  Filled 2021-02-10: qty 20

## 2021-02-10 MED ORDER — IOHEXOL 300 MG/ML  SOLN
100.0000 mL | Freq: Once | INTRAMUSCULAR | Status: AC | PRN
  Administered 2021-02-10: 100 mL via INTRAVENOUS

## 2021-02-10 MED ORDER — ONDANSETRON HCL 4 MG/2ML IJ SOLN
4.0000 mg | Freq: Once | INTRAMUSCULAR | Status: AC
  Administered 2021-02-10: 4 mg via INTRAVENOUS
  Filled 2021-02-10: qty 2

## 2021-02-10 MED ORDER — FENTANYL CITRATE (PF) 100 MCG/2ML IJ SOLN
50.0000 ug | Freq: Once | INTRAMUSCULAR | Status: AC
  Administered 2021-02-10: 50 ug via INTRAVENOUS
  Filled 2021-02-10: qty 2

## 2021-02-10 MED ORDER — CEPHALEXIN 500 MG PO CAPS: 500.0000 mg | ORAL_CAPSULE | Freq: Four times a day (QID) | ORAL | 0 refills | Status: AC

## 2021-02-10 MED ORDER — CEPHALEXIN 500 MG PO CAPS
500.0000 mg | ORAL_CAPSULE | Freq: Once | ORAL | Status: AC
  Administered 2021-02-10: 500 mg via ORAL
  Filled 2021-02-10: qty 1

## 2021-02-10 MED ORDER — HYDROCODONE-ACETAMINOPHEN 5-325 MG PO TABS
1.0000 | ORAL_TABLET | Freq: Once | ORAL | Status: AC
  Administered 2021-02-10: 1 via ORAL
  Filled 2021-02-10: qty 1

## 2021-02-10 NOTE — ED Notes (Signed)
Pt returned from CT °

## 2021-02-10 NOTE — ED Provider Notes (Signed)
[place] DEPT Provider Note   CSN: [id number] Arrival date & time: 02/10/21  1031     History Chief Complaint  Patient presents with  . Leg Pain    Monique King is a 87 y.o. female BIB from [facility] who presents for evaluation of isolated swelling to the left leg.  Patient states she does not know what happened.  Daughter states that she was called today and said that patient had some fluid around her leg.  She is unsure of how it happened.  She last saw patient 4 days ago and did not notice that.  EM LEVEL 5 CAVEAT DUE TO MEMORY ISSUES  The history is provided by a relative.       Past Medical History:  Diagnosis Date  . Breast cancer [facility])     Patient Active Problem List   Diagnosis Date Noted  . Cellulitis of right upper extremity 10/03/2019  . Bacteremia due to Staphylococcus aureus 10/02/2019  . Fever 10/01/2019  . Thrombocytopenia ([place]) 10/01/2019  . COVID-19 virus infection 10/01/2019  . Lab test positive for detection of COVID-19 virus 10/01/2019  . Acute respiratory failure with hypoxia ([place]) 09/30/2019  . Cardiac murmur 09/30/2019    Past Surgical History:  Procedure Laterality Date  . ABDOMINAL HYSTERECTOMY    . MASTECTOMY Right      OB History   No obstetric history on file.     No family history on file.  Social History   Tobacco Use  . Smoking status: Never Smoker  . Smokeless tobacco: Never Used  Substance Use Topics  . Alcohol use: Never  . Drug use: Never    Home Medications Prior to Admission medications   Medication Sig Start Date End Date Taking? Authorizing Provider  acetaminophen (TYLENOL) 500 MG tablet Take 1,000 mg by mouth 3 (three) times daily.   Yes [provider]  aspirin EC 81 MG tablet Take 81 mg by mouth daily. Swallow whole.   Yes [provider]  cephALEXin (KEFLEX) 500 MG capsule Take 1 capsule (500 mg total) by mouth 4 (four) times daily for  7 days. 02/10/21 02/17/21 Yes Volanda Napoleon, PA-C  docusate sodium (COLACE) 100 MG capsule Take 100 mg by mouth daily.   Yes [provider]  glipiZIDE (GLUCOTROL) 5 MG tablet Take 2.5 mg by mouth daily before breakfast.   Yes [provider]  ketoconazole (NIZORAL) 2 % cream Apply 1 application topically at bedtime. Left breast and bottom.   Yes [provider]  mometasone (ELOCON) 0.1 % ointment Apply 1 application topically 2 (two) times daily. To affected areas on scalp   Yes [provider]  naproxen sodium (ALEVE) 220 MG tablet Take 220 mg by mouth 2 (two) times daily with a meal.   Yes [provider]  omeprazole (PRILOSEC) 20 MG capsule Take 20 mg by mouth daily. 01/21/21  Yes [provider]  simvastatin (ZOCOR) 10 MG tablet Take 10 mg by mouth daily.   Yes [provider]  traZODone (DESYREL) 50 MG tablet Take 50 mg by mouth at bedtime.   Yes [provider]  triamcinolone (KENALOG) 0.1 % Apply 1 application topically daily. Under left breast   Yes [provider]  zinc oxide 20 % ointment Apply 1 application topically daily as needed for diaper changes. Apply to bottom   Yes [provider]  diazepam (VALIUM) 2 MG tablet Take 2.5 tablets (5 mg total) by  mouth at bedtime as needed for anxiety. Patient not taking: Reported on 02/10/2021 10/06/19   Arrien, Jimmy Picket, MD  fentaNYL (DURAGESIC) 25 MCG/HR Place 1 patch onto the skin every 3 (three) days. Patient not taking: Reported on 02/10/2021 10/07/19   Arrien, Jimmy Picket, MD  furosemide (LASIX) 20 MG tablet Take 1 tablet (20 mg total) by mouth daily as needed for fluid (dyspnea.). Patient not taking: Reported on 02/10/2021 10/06/19   Arrien, Jimmy Picket, MD    Allergies    Patient has no known allergies.  Review of Systems   Review of Systems  Unable to perform ROS: Dementia    Physical Exam Updated Vital Signs BP 121/71   Pulse  88   Temp 97.7 F (36.5 C) (Oral)   Resp (!) 25   Ht 5\' 5"  (1.651 m)   Wt 78 kg   SpO2 91%   BMI 28.62 kg/m   Physical Exam Vitals and nursing note reviewed.  Constitutional:      Appearance: Normal appearance. She is well-developed.  HENT:     Head: Normocephalic and atraumatic.  Eyes:     General: Lids are normal.     Conjunctiva/sclera: Conjunctivae normal.     Pupils: Pupils are equal, round, and reactive to light.  Cardiovascular:     Rate and Rhythm: Normal rate and regular rhythm.     Pulses: Normal pulses.          Dorsalis pedis pulses are 2+ on the right side and 2+ on the left side.     Heart sounds: Normal heart sounds. No murmur heard. No friction rub. No gallop.   Pulmonary:     Effort: Pulmonary effort is normal.     Breath sounds: Normal breath sounds.  Abdominal:     Palpations: Abdomen is soft. Abdomen is not rigid.     Tenderness: There is no abdominal tenderness. There is no guarding.  Musculoskeletal:        General: Normal range of motion.     Cervical back: Full passive range of motion without pain.     Comments: 18 x 10 cm area of swelling, ecchymosis that does have some central fluctuance noted to the lateral aspect of the left tibfib. No overlying warmth, erythema. Diffuse tenderness to the area. No bony deformity or crepitus noted. Compartments are soft.   Skin:    General: Skin is warm and dry.     Capillary Refill: Capillary refill takes less than 2 seconds.     Comments: Good distal cap refill. LLE is not dusky in appearance or cool to touch.  Neurological:     Mental Status: She is alert.     Comments: She can tell me her name and where we are at. When I ask her what year, she says "2000 something." MAE Follows commands.   Psychiatric:        Speech: Speech normal.          ED Results / Procedures / Treatments   Labs (all labs ordered are listed, but only abnormal results are displayed) Labs Reviewed  BASIC METABOLIC PANEL -  Abnormal; Notable for the following components:      Result Value   Glucose, Bld 203 (*)    BUN 24 (*)    All other components within normal limits  CBC WITH DIFFERENTIAL/PLATELET - Abnormal; Notable for the following components:   RBC 3.70 (*)    Abs Immature Granulocytes 0.09 (*)    All other  components within normal limits  URINALYSIS, ROUTINE W REFLEX MICROSCOPIC - Abnormal; Notable for the following components:   Glucose, UA 50 (*)    Hgb urine dipstick SMALL (*)    Nitrite POSITIVE (*)    Leukocytes,Ua MODERATE (*)    Bacteria, UA RARE (*)    All other components within normal limits    EKG None  Radiology CT TIBIA FIBULA LEFT W CONTRAST  Result Date: 02/10/2021 CLINICAL DATA:  Rapid development of lateral left lower leg swelling with overlying bruising EXAM: CT OF THE LOWER RIGHT EXTREMITY WITH CONTRAST TECHNIQUE: Multidetector CT imaging of the lower right extremity was performed according to the standard protocol following intravenous contrast administration. CONTRAST:  170mL OMNIPAQUE IOHEXOL 300 MG/ML  SOLN COMPARISON:  None. FINDINGS: Bones/Joint/Cartilage Severe tricompartmental osteoarthritis of the right knee. 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 No knee joint effusion or hemarthrosis. No acute fracture. No dislocation. Bones appear demineralized. Ligaments Suboptimally assessed by CT. Muscles and Tendons Diffuse lower leg muscle atrophy. No intramuscular fluid collection. Distal Achilles tendon is thickened compatible with tendinosis. Soft tissues Large heterogeneously hyperattenuating superficial fluid collection overlying the anterolateral aspect of the left lower leg measuring approximately 17.5 x 4.0 x 7.5 cm (series 10, image 35; series 4, image 150), approximate volume of 275 mL. Mild diffuse subcutaneous edema.  Vascular calcifications. IMPRESSION: 1. Large hyperdense superficial fluid collection overlying the anterolateral aspect of the left lower leg measuring approximately 17.5 x 4.0 x 7.5 cm (approximate volume of 275 mL). Findings are most suggestive of a hematoma. 2. Severe tricompartmental osteoarthritis of the right knee. 3. 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 4. Distal Achilles tendinosis. Electronically Signed   By: Davina Poke D.O.   On: 02/10/2021 13:36    Procedures .Marland KitchenIncision and Drainage  Date/Time: 02/10/2021 3:21 PM Performed by: Volanda Napoleon, PA-C Authorized by: Volanda Napoleon, PA-C   Consent:    Consent obtained:  Verbal   Consent given by:  Patient   Risks, benefits, and alternatives were discussed: yes     Risks discussed:  Bleeding, incomplete drainage, pain and infection   Alternatives discussed:  Delayed treatment and alternative treatment Universal protocol:    Procedure explained and questions answered to patient or proxy's satisfaction: yes     Relevant documents present and verified: yes     Test results available : yes     Imaging studies available: yes     Required blood products, implants, devices, and special equipment available: yes     Site/side marked: yes     Immediately prior to procedure, a time out was called: yes     Patient identity confirmed:  Arm band Location:    Type:  Hematoma   Size:  18   Location:  Lower extremity   Lower extremity location:  Leg   Leg location:  L lower leg Pre-procedure details:    Skin preparation:  Chlorhexidine with alcohol Sedation:    Sedation type:  None Anesthesia:    Anesthesia method:  Topical application Procedure type:    Complexity:  Simple Procedure details:    Ultrasound guidance: no     Needle aspiration: yes     Needle size:  18 G   Wound management:  Probed and deloculated, irrigated with saline, extensive cleaning  and debrided  Drainage:  Bloody   Drainage amount:  Copious   Wound treatment:  Wound left open Post-procedure details:    Procedure completion:  Procedure terminated due to patient's clinical status Comments:     The area was cleaned with alcohol.  Topical lidocaine spray was used to numb the area.  Using an 18-gauge needle, needle aspiration was performed.  I was able to obtain between 20-25 cc of blood.  I attempted to irrigate the area from the needle pinpoint.  Through irrigation, the skin was so taut that it started opening up spontaneously slightly.  The skin opened up approximately 2 cm.  The hematoma was expressed through this opening which showed a copious amount of clotted hematoma.  The area was thoroughly expressed, irrigated with sterile saline.  No evidence of arterial bleeding.  Compressive dressing was applied.     Medications Ordered in ED Medications  iohexol (OMNIPAQUE) 300 MG/ML solution 100 mL (100 mLs Intravenous Contrast Given 02/10/21 1247)  HYDROcodone-acetaminophen (NORCO/VICODIN) 5-325 MG per tablet 1 tablet (1 tablet Oral Given 02/10/21 1418)  lidocaine-EPINEPHrine (XYLOCAINE W/EPI) 2 %-1:200000 (PF) injection 10 mL (10 mLs Intradermal Given 02/10/21 1437)  fentaNYL (SUBLIMAZE) injection 50 mcg (50 mcg Intravenous Given 02/10/21 1526)  ondansetron (ZOFRAN) injection 4 mg (4 mg Intravenous Given 02/10/21 1526)  cephALEXin (KEFLEX) capsule 500 mg (500 mg Oral Given 02/10/21 1526)    ED Course  I have reviewed the triage vital signs and the nursing notes.  Pertinent labs & imaging results that were available during my care of the patient were reviewed by me and considered in my medical decision making (see chart for details).    MDM Rules/Calculators/A&P                          87 year old female who presents for evaluation of swelling to the left lateral leg.  Patient is a poor historian.  On initial arrival, she is afebrile, nontoxic-appearing.  Vital signs are  stable.  On exam, she is neurovascularly intact.  She has a large 18 x 10 cm area of swelling, ecchymosis with fluctuance concerning for hematoma noted to the lateral aspect of her left tib-fib.  History/physical exam not concerning for septic arthritis, infectious etiology.  We will plan for imaging.  11:01 AM: Attempted to call [facility]. They attempted to connect me with her nurses but they were unable to answer the phone.   Discussed with daughter who is at bedside.  She states patient is at her mental baseline.  She states she is in or normally not oriented to time and does have some slight confusion.  CBC shows no leukocytosis, anemia. BMP unremarkable.  UA shows positive nitrites, moderate leukocytes, pyuria.  CT scan shows a large superficial fluid collection overlying the anterior lateral aspect of the left lower extremity measuring approximately 17 x 7 cm.  Findings are suggestive of a hematoma.  Discussed results with patient and daughter.  We will plan to put her on antibiotics for the UTI.  Additionally, we discussed treatment management.  I discussed with her the entirety of the hematoma noted on the CT scan.  We discussed conservative management and daughter and patient are agreeable.  I went to discussed with patient and daughter.  Patient was having some worsening pain to the hematoma.  It did look slightly more swollen. Her compartments were soft.  We discussed further treatment options options, including I&D and needle aspiration.  Dr.  Roderic Palau was present for this conversation.  We discussed risk first benefits of incision and drainage and the risk of potential infection.  We also discussed the benefits of pain control, helping with improvement.  After discussion with daughter, she is agreeable for needle aspiration/I&D here in the ED.  I&D as documented above.  Patient tolerated procedure well.  The area was dressed.  Repeat evaluation shows good distal pulses, good  cap refill.  Given that the area on the skin spontaneously opened up on its own, it created a small 2 cm opening to the lateral aspect.  She was going to be put on antibiotics for her UTI.  This also hopefully prevent infection.  I discussed with patient's daughter at length regarding wound care instructions.  Additionally, discussed with her that she needs repeat blood work in 4 days to ensure her hemoglobin is stable. At this time, patient exhibits no emergent life-threatening condition that require further evaluation in ED.   Portions of this note were generated with [employer]. Dictation errors may occur despite best attempts at proofreading.   Final Clinical Impression(s) / ED Diagnoses Final diagnoses:  Hematoma  Acute cystitis without hematuria    Rx / DC Orders ED Discharge Orders         Ordered    cephALEXin (KEFLEX) 500 MG capsule  4 times daily        02/10/21 1530           Desma Mcgregor 02/10/21 1620    Milton Ferguson, MD 02/12/21 [phone]

## 2021-02-10 NOTE — ED Notes (Signed)
Pt able to state name and birthday, unable to answer other questions appropriately, unsure pt's baseline. Provider aware

## 2021-02-10 NOTE — ED Notes (Signed)
Patient transported to CT 

## 2021-02-10 NOTE — ED Notes (Signed)
[facility] here to pick pt up. Daughter at bedside, went over dc papers w daughter, went over rx that was sent to pharmacy, follow-up for re-check of hgb level and wound care. Daughter understood and did not have questions.

## 2021-02-10 NOTE — Discharge Instructions (Signed)
The dressing can stay on for 24 hours.  Needs to be changed tomorrow.  After that, should be changed daily.  The area should be gently cleaned with soap and water.  The area should be patted dry before placing any bandage on it.  She needs to take antibiotics as directed.  She will need repeat blood work in about 4 days to recheck her hemoglobin to make sure it has not dropped.  Closely monitor the wound.  If she starts having worsening pain, fevers, redness or swelling of the leg or starts having drainage other than this mild amount of bloody drainage, she needs to return to the emergency department immediately.  She can also follow up with the wound care clinic.

## 2021-02-10 NOTE — ED Triage Notes (Signed)
Pt brought in by EMS for "pocket of fluid on left leg". Fluid around shin/calf area. Denies any issues moving legs, foot or toe. Pt states she thinks it "appeared overnight".  152/82 HR 89 Ow 94 bgl 257

## 2021-02-10 NOTE — ED Notes (Signed)
Daughter states pt is c/o leg pain, provider made aware

## 2021-02-10 NOTE — ED Notes (Signed)
Called [facility] for pt transport back to facility

## 2021-02-10 NOTE — ED Notes (Signed)
Provider at bedside, lido and I&D trey at bedside

## 2021-05-11 ENCOUNTER — Emergency Department (HOSPITAL_COMMUNITY)
Admission: EM | Admit: 2021-05-11 | Discharge: 2021-05-11 | Disposition: A | Discharge status: Home / Self Care | Payer: Medicare Other | Attending: Emergency Medicine | Admitting: Emergency Medicine

## 2021-05-11 ENCOUNTER — Emergency Department (HOSPITAL_COMMUNITY): Payer: Medicare Other

## 2021-05-11 ENCOUNTER — Other Ambulatory Visit: Payer: Self-pay

## 2021-05-11 DIAGNOSIS — Z853 Personal history of malignant neoplasm of breast: Secondary | ICD-10-CM | POA: Insufficient documentation

## 2021-05-11 DIAGNOSIS — Z8616 Personal history of COVID-19: Secondary | ICD-10-CM | POA: Insufficient documentation

## 2021-05-11 DIAGNOSIS — R5381 Other malaise: Secondary | ICD-10-CM | POA: Diagnosis not present

## 2021-05-11 DIAGNOSIS — Z7982 Long term (current) use of aspirin: Secondary | ICD-10-CM | POA: Insufficient documentation

## 2021-05-11 DIAGNOSIS — R531 Weakness: Secondary | ICD-10-CM | POA: Diagnosis present

## 2021-05-11 LAB — CBG MONITORING, ED: Glucose-Capillary: 210 mg/dL — ABNORMAL HIGH (ref 70–99)

## 2021-05-11 NOTE — ED Notes (Signed)
[facility] present for pt transfer

## 2021-05-11 NOTE — Discharge Instructions (Signed)
You were seen in the emergency department for feeling unwell after dinner.  Your symptoms improved while you were here.  Your daughter said this is happened to you before and did not feel that any blood work or other testing was indicated.  You were agreeable to return back to her facility.  Return to the emergency department for any worsening or concerning symptoms

## 2021-05-11 NOTE — ED Provider Notes (Signed)
[facility] DEPT Provider Note   CSN: [id number] Arrival date & time: 05/11/21  1820     History No chief complaint on file.   Monique King is a 87 y.o. female.  She is brought in by EMS from [facility] facility after stating she does not feel good after having dinner.  She cannot really explain what is bothering her.  She denies headache blurry vision double vision chest pain shortness of breath abdominal pain nausea vomiting diarrhea or urinary symptoms.  She continues to just complain of vague things like I feel "cruddy" and "I do not feel myself".  EMS found the patient's blood sugar to be mildly elevated in the 200s.  The history is provided by the patient and the EMS personnel.  Weakness Severity:  Unable to specify Onset quality:  Sudden Duration:  1 hour Progression:  Unchanged Chronicity:  New Relieved by:  None tried Worsened by:  Nothing Ineffective treatments:  None tried Associated symptoms: no abdominal pain, no chest pain, no cough, no diarrhea, no dysuria, no fever, no nausea, no shortness of breath, no stroke symptoms and no vision change       Past Medical History:  Diagnosis Date   Breast cancer [facility])     Patient Active Problem List   Diagnosis Date Noted   Cellulitis of right upper extremity 10/03/2019   Bacteremia due to Staphylococcus aureus 10/02/2019   Fever 10/01/2019   Thrombocytopenia ([place]) 10/01/2019   COVID-19 virus infection 10/01/2019   Lab test positive for detection of COVID-19 virus 10/01/2019   Acute respiratory failure with hypoxia ([place]) 09/30/2019   Cardiac murmur 09/30/2019    Past Surgical History:  Procedure Laterality Date   ABDOMINAL HYSTERECTOMY     MASTECTOMY Right      OB History   No obstetric history on file.     No family history on file.  Social History   Tobacco Use   Smoking status: Never   Smokeless tobacco: Never  Substance Use Topics   Alcohol use: Never   Drug  use: Never    Home Medications Prior to Admission medications   Medication Sig Start Date End Date Taking? Authorizing Provider  acetaminophen (TYLENOL) 500 MG tablet Take 1,000 mg by mouth 3 (three) times daily.    [provider]  aspirin EC 81 MG tablet Take 81 mg by mouth daily. Swallow whole.    [provider]  diazepam (VALIUM) 2 MG tablet Take 2.5 tablets (5 mg total) by mouth at bedtime as needed for anxiety. Patient not taking: Reported on 02/10/2021 10/06/19   Arrien, Jimmy Picket, MD  docusate sodium (COLACE) 100 MG capsule Take 100 mg by mouth daily.    [provider]  fentaNYL (DURAGESIC) 25 MCG/HR Place 1 patch onto the skin every 3 (three) days. Patient not taking: Reported on 02/10/2021 10/07/19   Arrien, Jimmy Picket, MD  furosemide (LASIX) 20 MG tablet Take 1 tablet (20 mg total) by mouth daily as needed for fluid (dyspnea.). Patient not taking: Reported on 02/10/2021 10/06/19   Arrien, Jimmy Picket, MD  glipiZIDE (GLUCOTROL) 5 MG tablet Take 2.5 mg by mouth daily before breakfast.    [provider]  ketoconazole (NIZORAL) 2 % cream Apply 1 application topically at bedtime. Left breast and bottom.    [provider]  mometasone (ELOCON) 0.1 % ointment Apply 1 application topically 2 (two) times daily. To affected areas on scalp    [provider]  naproxen sodium (ALEVE) 220 MG tablet Take 220 mg by mouth 2 (two) times daily with a meal.    [provider]  omeprazole (PRILOSEC) 20 MG capsule Take 20 mg by mouth daily. 01/21/21   [provider]  simvastatin (ZOCOR) 10 MG tablet Take 10 mg by mouth daily.    [provider]  traZODone (DESYREL) 50 MG tablet Take 50 mg by mouth at bedtime.    [provider]  triamcinolone (KENALOG) 0.1 % Apply 1 application topically daily. Under left breast    [provider]  zinc oxide 20 % ointment Apply 1 application topically daily  as needed for diaper changes. Apply to bottom    [provider]    Allergies    Patient has no known allergies.  Review of Systems   Review of Systems  Constitutional:  Negative for fever.  HENT:  Negative for sore throat.   Eyes:  Negative for visual disturbance.  Respiratory:  Negative for cough and shortness of breath.   Cardiovascular:  Positive for leg swelling. Negative for chest pain.  Gastrointestinal:  Negative for abdominal pain, diarrhea and nausea.  Genitourinary:  Negative for dysuria.  Musculoskeletal:  Negative for neck pain.  Skin:  Negative for rash.  Neurological:  Positive for weakness.   Physical Exam Updated Vital Signs BP (!) 141/76 (BP Location: Left Arm)   Pulse 80   Temp 97.9 F (36.6 C) (Oral)   Resp 18   SpO2 95%   Physical Exam Vitals and nursing note reviewed.  Constitutional:      General: She is not in acute distress.    Appearance: Normal appearance. She is well-developed.  HENT:     Head: Normocephalic and atraumatic.  Eyes:     Conjunctiva/sclera: Conjunctivae normal.  Cardiovascular:     Rate and Rhythm: Normal rate and regular rhythm.     Heart sounds: No murmur heard. Pulmonary:     Effort: Pulmonary effort is normal. No respiratory distress.     Breath sounds: Normal breath sounds.  Abdominal:     Palpations: Abdomen is soft.     Tenderness: There is no abdominal tenderness.  Musculoskeletal:        General: No deformity or signs of injury. Normal range of motion.     Cervical back: Neck supple.  Skin:    General: Skin is warm and dry.  Neurological:     General: No focal deficit present.     Mental Status: She is alert.     Cranial Nerves: No cranial nerve deficit.     Sensory: No sensory deficit.     Motor: No weakness.    ED Results / Procedures / Treatments   Labs (all labs ordered are listed, but only abnormal results are displayed) Labs Reviewed  CBG MONITORING, ED - Abnormal; Notable for the  following components:      Result Value   Glucose-Capillary 210 (*)    All other components within normal limits    EKG None  Radiology DG Chest Port 1 View  Result Date: 05/11/2021 CLINICAL DATA:  Weakness, shortness of breath EXAM: PORTABLE CHEST 1 VIEW COMPARISON:  None. FINDINGS: The heart size and mediastinal contours are within normal limits. Unchanged scarring of the right pulmonary apex. No acute appearing airspace opacity.\ The visualized skeletal structures are unremarkable. IMPRESSION: No acute abnormality of the lungs in AP portable projection. Electronically Signed   By: Eddie Candle M.D.   On: 05/11/2021 20:11  Procedures Procedures   Medications Ordered in ED Medications - No data to display  ED Course  I have reviewed the triage vital signs and the nursing notes.  Pertinent labs & imaging results that were available during my care of the patient were reviewed by me and considered in my medical decision making (see chart for details).  Clinical Course as of 05/12/21 8032  Sat May 11, 2021  1957 Patient's daughter is here.  She said her mom is done this 4- 5 times in the past year or so.  Her doctors think that some sort of vagal response after eating.  She does not feel that the patient needs any lab work and is hoping to get her back to [facility].  Patient herself currently denies any complaints.  As her vitals have been normal and exam benign I think this is a reasonable approach.  She is a DNR/DNI. [MB]    Clinical Course User Index [MB] Hayden Rasmussen, MD   MDM Rules/Calculators/A&P                         Differential diagnosis includes hyperglycemia, hypoglycemia metabolic derangement, arrhythmia, anemia.  Daughter states this is happened multiple times.  She does not wish patient undergo any further testing.  Return instructions discussed.  Final Clinical Impression(s) / ED Diagnoses Final diagnoses:  Malaise    Rx / DC Orders ED Discharge  Orders     None        Hayden Rasmussen, MD 05/12/21 [phone]

## 2021-05-11 NOTE — ED Notes (Signed)
[facility] called for pt transport to [facility]

## 2021-05-11 NOTE — ED Triage Notes (Signed)
Per EMS pt from [facility] facility in [place]. Hyperglycemic. Facility never checked vitals or cbg. EMS cbg was 223 A&O X4, pt states she feels "crudy"  120/90 P72 R18 O2 99 ra 97.6 T

## 2022-05-13 IMAGING — CT CT TIBIA FIBULA *L* W/ CM
2 of 5 series · 12 of 35 positions shown, 15 images · IV contrast (omnipaque)
Comparison: None.

CLINICAL DATA: Rapid development of lateral left lower leg swelling
with overlying bruising

EXAM:
CT OF THE LOWER RIGHT EXTREMITY WITH CONTRAST
TECHNIQUE: Multidetector CT imaging of the lower right extremity was performed
according to the standard protocol following intravenous contrast
administration.
CONTRAST:  100mL OMNIPAQUE IOHEXOL 300 MG/ML  SOLN

[Series 4: axial st · axial · 0.50mm/px · z∈[+250,+656]mm · 10 of 241 slices shown, 13 images]
[im 19/241  soft-tissue]
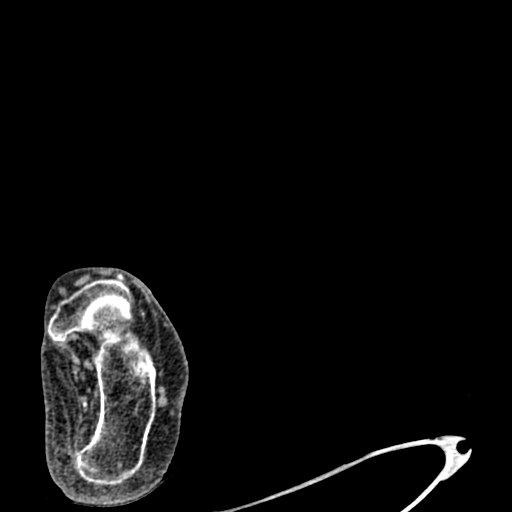
[im 19/241  bone]
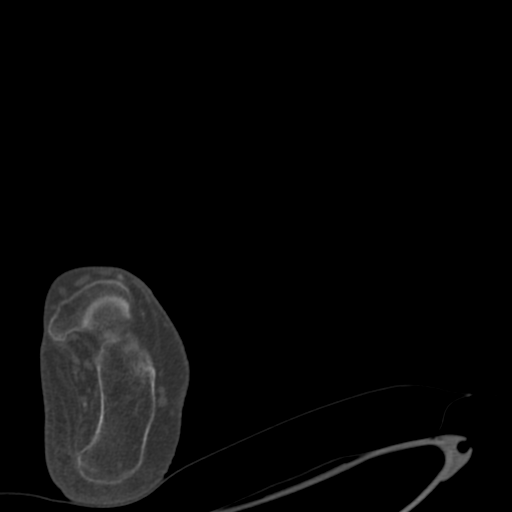
[im 37/241  bone]
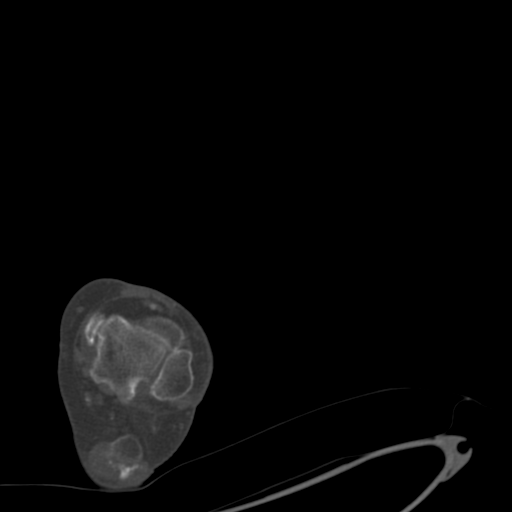
[im 74/241  bone]
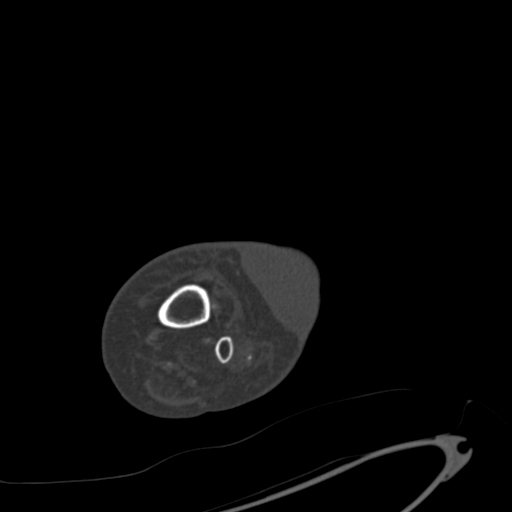
[im 93/241  bone]
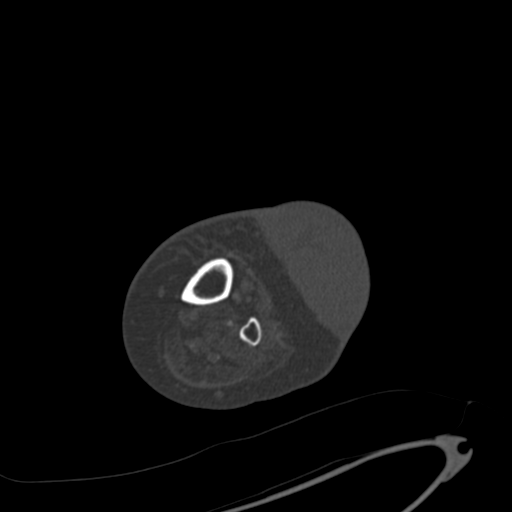
[im 111/241  soft-tissue]
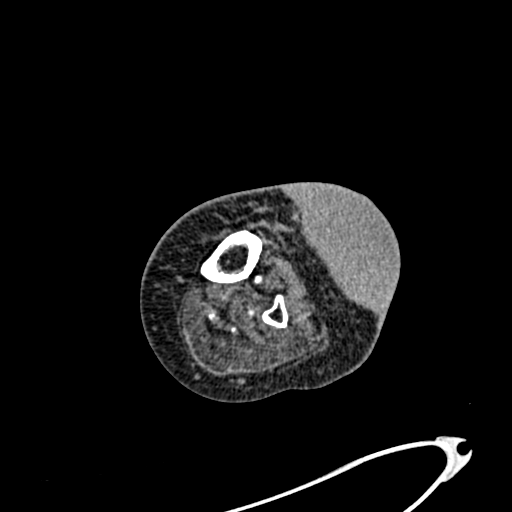
[im 111/241  bone]
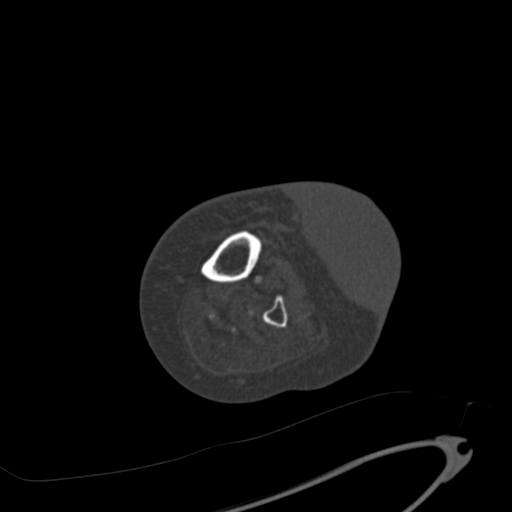
[im 130/241  bone]
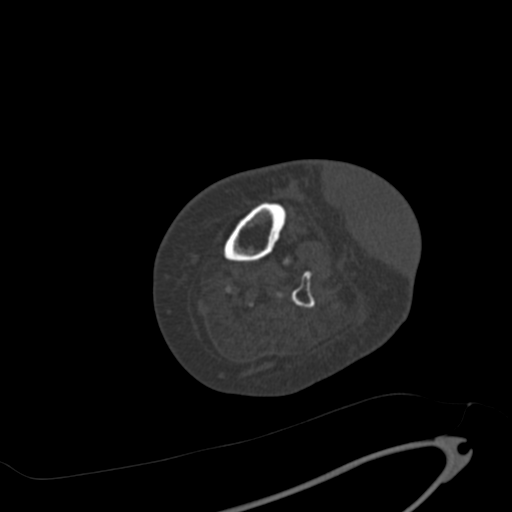
[im 148/241  bone]
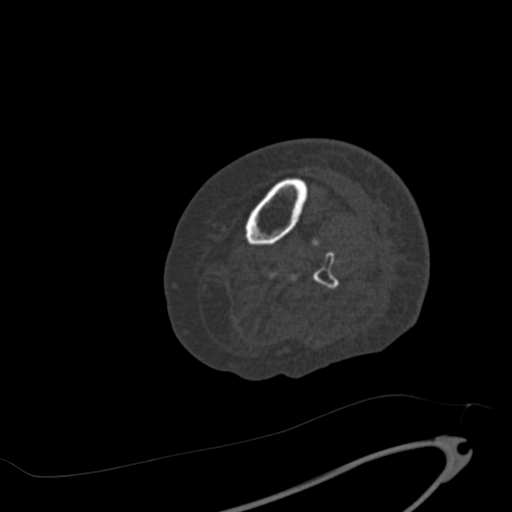
[im 185/241  bone]
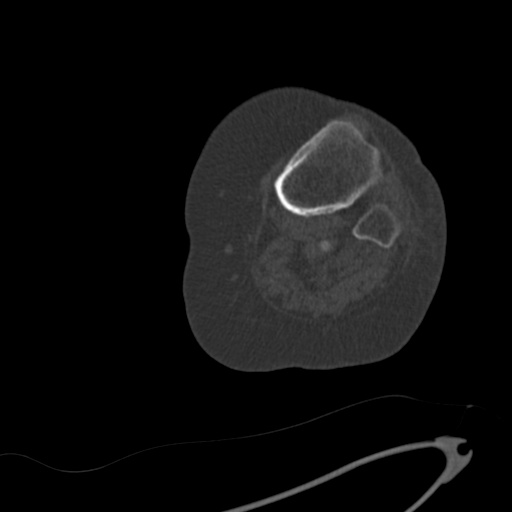
[im 204/241  soft-tissue]
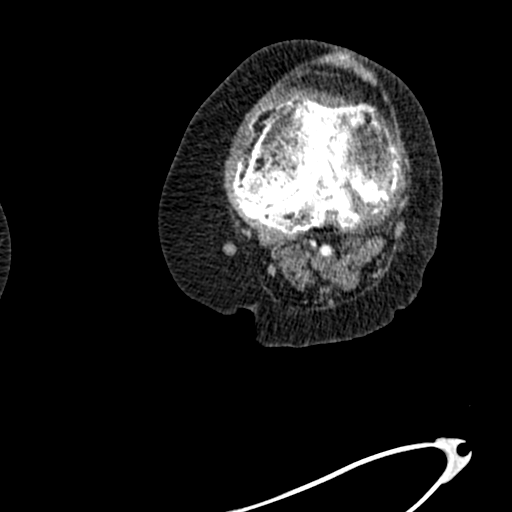
[im 204/241  bone]
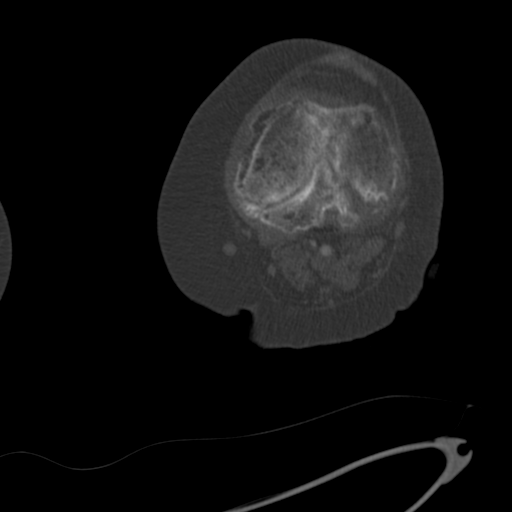
[im 222/241  bone]
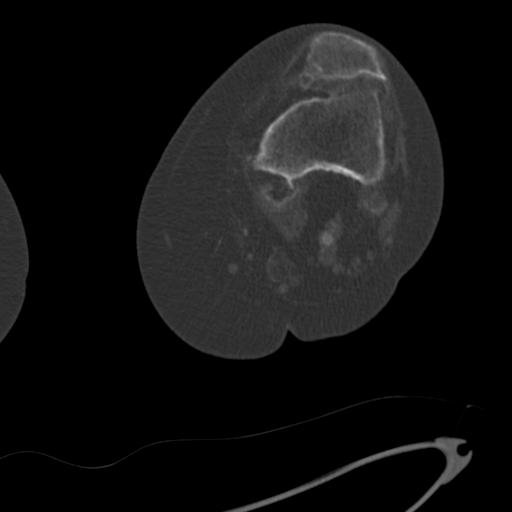

[Series 8: coronal bone · coronal · 0.35mm/px · 2 of 89 slices shown]
[im 9/89  bone]
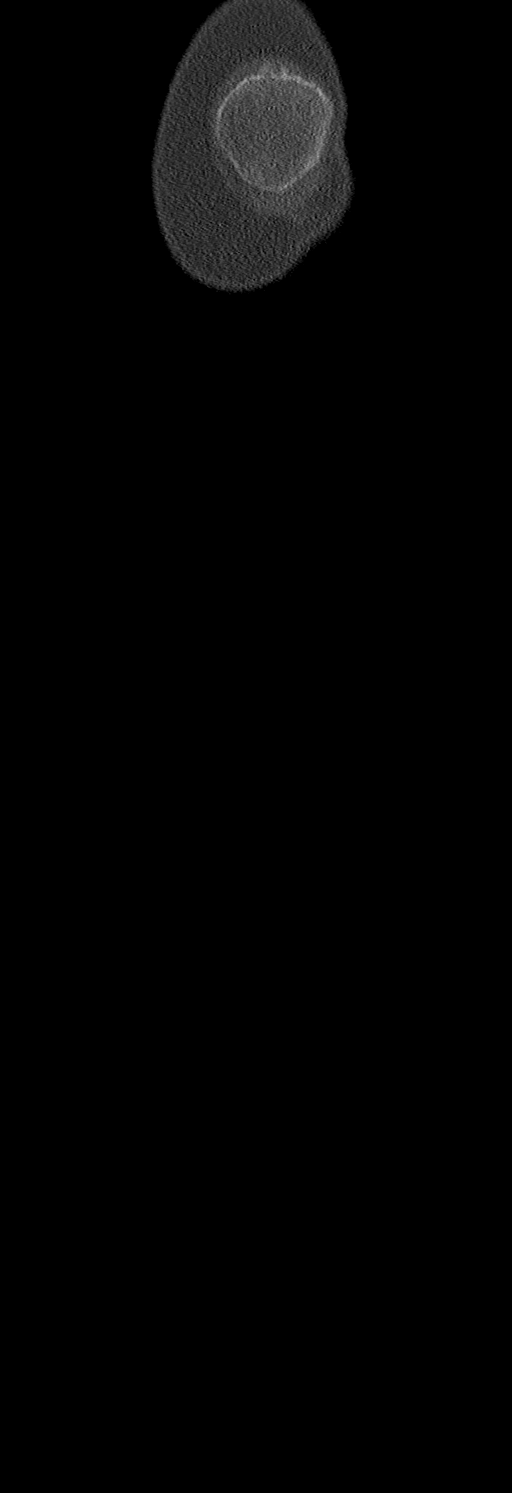
[im 49/89  bone]
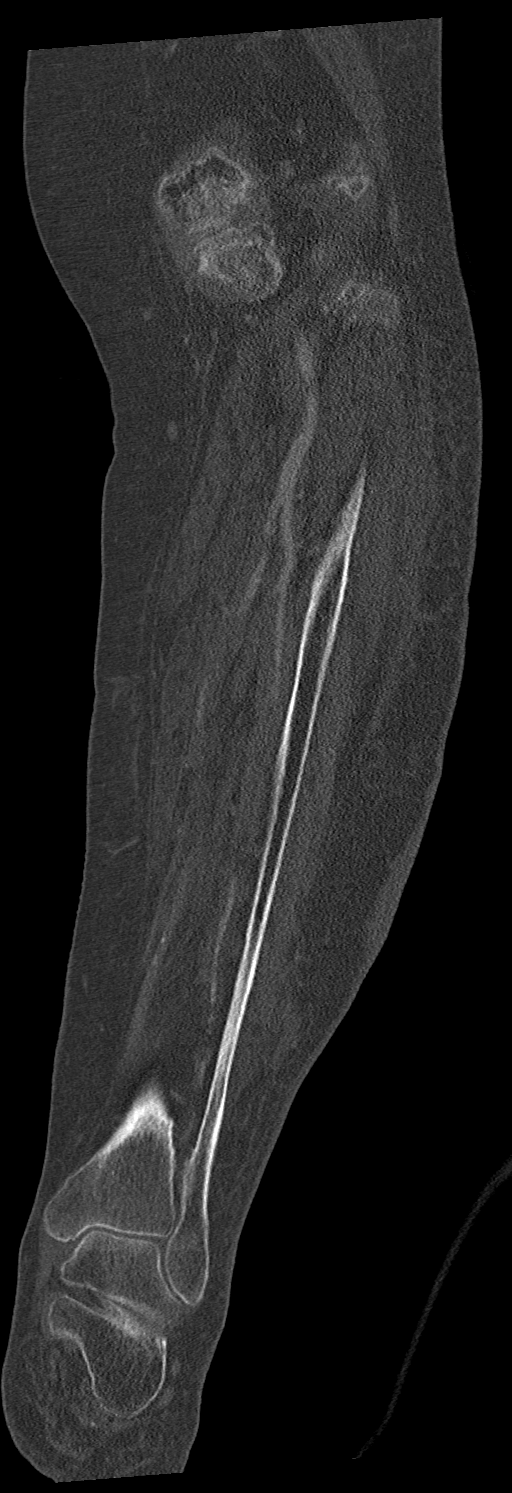

[12 of 35 positions shown; findings below may reference images not displayed]

FINDINGS: Bones/Joint/Cartilage

Severe tricompartmental osteoarthritis of the right knee. Chronic
ununited fracture of the peripheral aspect of the medial tibial
plateau with well corticated fracture margins and secondary
degenerative subchondral cystic changes (series 8, image 31). No
knee joint effusion or hemarthrosis. No acute fracture. No
dislocation. Bones appear demineralized.

Ligaments

Suboptimally assessed by CT.

Muscles and Tendons

Diffuse lower leg muscle atrophy. No intramuscular fluid collection.
Distal Achilles tendon is thickened compatible with tendinosis.

Soft tissues

Large heterogeneously hyperattenuating superficial fluid collection
overlying the anterolateral aspect of the left lower leg measuring
approximately 17.5 x 4.0 x 7.5 cm (series 10, image 35; series 4,
image 150), approximate volume of 275 mL. Mild diffuse subcutaneous
edema. Vascular calcifications.
IMPRESSION: 1. Large hyperdense superficial fluid collection overlying the
anterolateral aspect of the left lower leg measuring approximately
17.5 x 4.0 x 7.5 cm (approximate volume of 275 mL). Findings are
most suggestive of a hematoma.
2. Severe tricompartmental osteoarthritis of the right knee.
3. Chronic ununited fracture of the peripheral aspect of the medial
tibial plateau with secondary arthrosis.
4. Distal Achilles tendinosis.

## 2023-03-18 DEATH — deceased
